# Patient Record
Sex: Male | Born: 1990
Health system: Southern US, Community
[De-identification: ages and names within clinical notes are randomized; demographics above are authoritative.]

## PROBLEM LIST (undated history)

## (undated) DIAGNOSIS — E119 Type 2 diabetes mellitus without complications: Secondary | ICD-10-CM

## (undated) HISTORY — DX: Type 2 diabetes mellitus without complications: E11.9

---
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
## 2013-08-29 ENCOUNTER — Emergency Department: Payer: Self-pay | Admitting: Emergency Medicine

## 2015-02-02 IMAGING — CR RIGHT HAND - COMPLETE 3+ VIEW
1 series · 3 of 3 positions shown · non-contrast
Comparison: 09/29/1998

CLINICAL DATA: Pain

EXAM:
RIGHT HAND - COMPLETE 3+ VIEW

[Series 1: x hand pa right · 0.14mm/px · 3 of 3 slices shown]
[im 1/3]
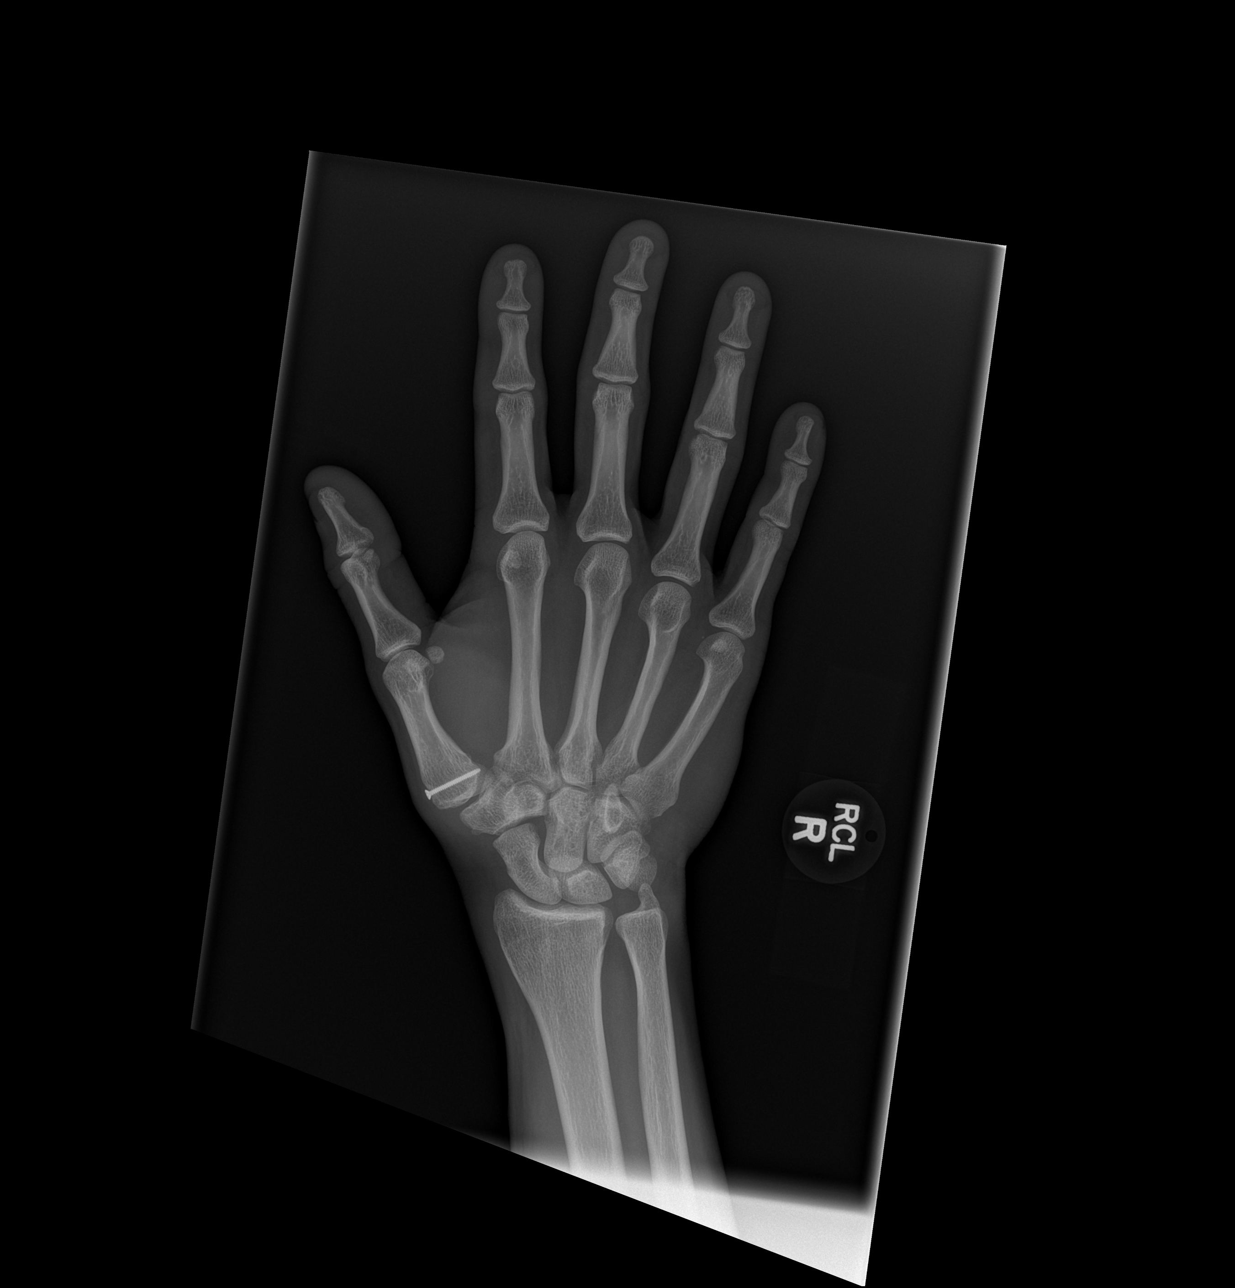
[im 2/3]
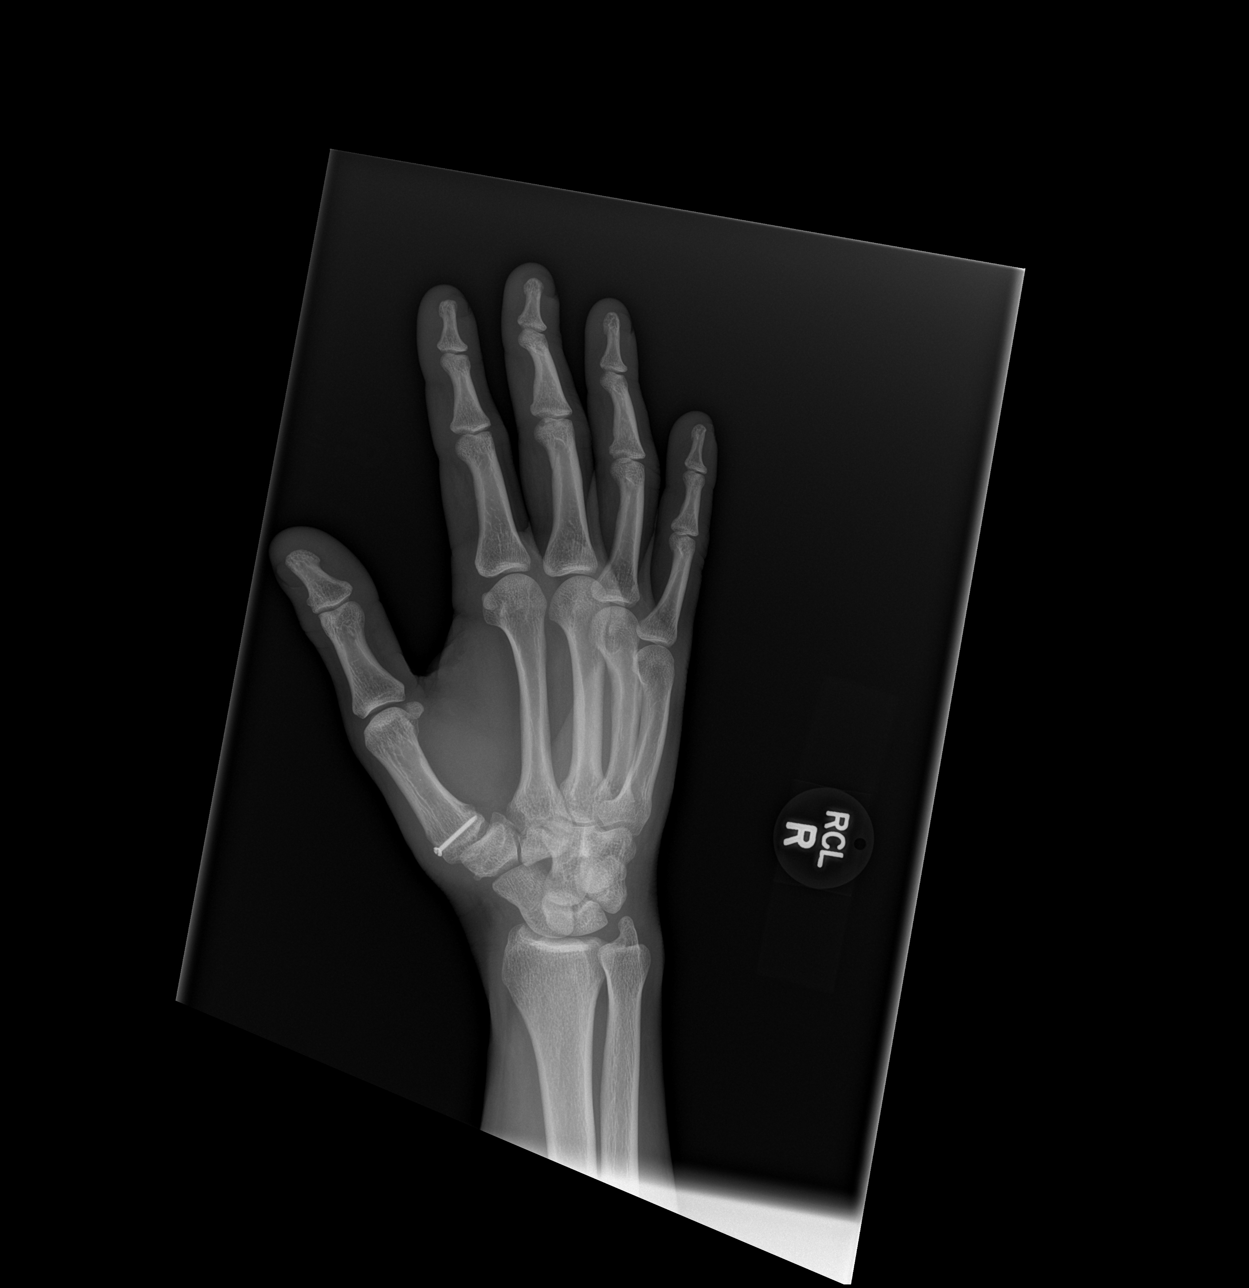
[im 3/3]
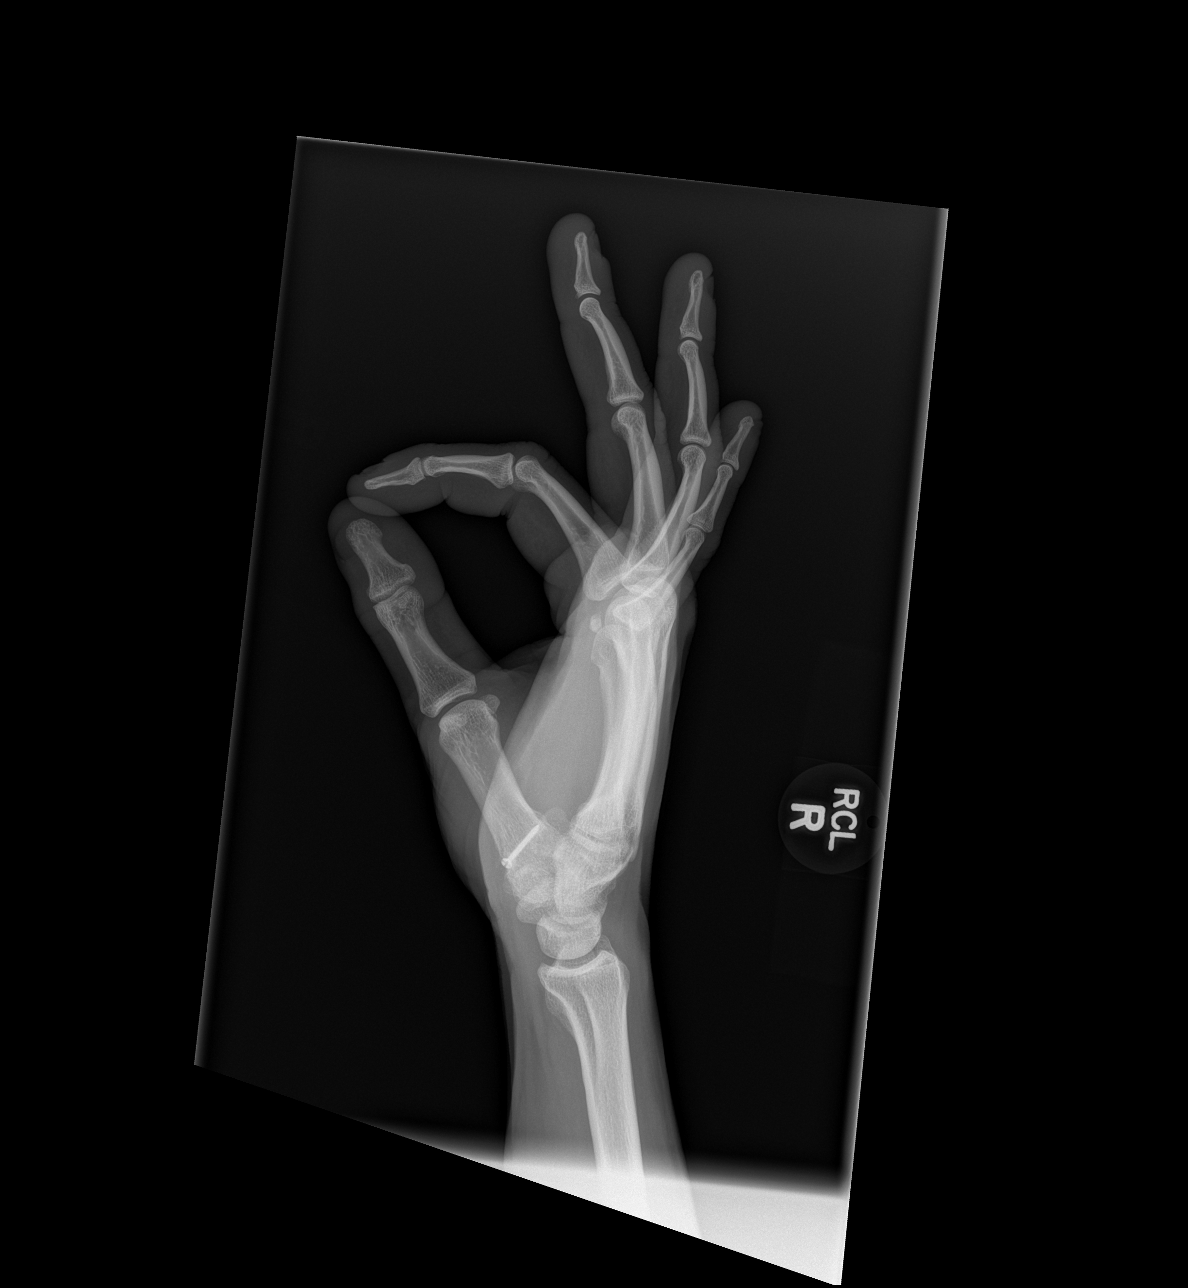

[3 of 3 positions shown; findings below may reference images not displayed]

FINDINGS: Patient is status post remote open reduction internal fixation of
the base of the first metacarpal. Hardware appears intact without
evidence of loosening or failure. The native osseous structures are
unremarkable. There is no evidence of acute fracture nor
dislocation.
IMPRESSION: No evidence of acute osseous abnormalities.

## 2016-10-14 ENCOUNTER — Encounter: Payer: Self-pay | Admitting: Emergency Medicine

## 2016-10-14 ENCOUNTER — Emergency Department
Admission: EM | Admit: 2016-10-14 | Discharge: 2016-10-14 | Disposition: A | Discharge status: Home / Self Care | Payer: Self-pay | Attending: Emergency Medicine | Admitting: Emergency Medicine

## 2016-10-14 DIAGNOSIS — Y939 Activity, unspecified: Secondary | ICD-10-CM | POA: Insufficient documentation

## 2016-10-14 DIAGNOSIS — Y999 Unspecified external cause status: Secondary | ICD-10-CM | POA: Insufficient documentation

## 2016-10-14 DIAGNOSIS — F17210 Nicotine dependence, cigarettes, uncomplicated: Secondary | ICD-10-CM | POA: Insufficient documentation

## 2016-10-14 DIAGNOSIS — Y929 Unspecified place or not applicable: Secondary | ICD-10-CM | POA: Insufficient documentation

## 2016-10-14 DIAGNOSIS — X58XXXA Exposure to other specified factors, initial encounter: Secondary | ICD-10-CM | POA: Insufficient documentation

## 2016-10-14 DIAGNOSIS — S0501XA Injury of conjunctiva and corneal abrasion without foreign body, right eye, initial encounter: Secondary | ICD-10-CM

## 2016-10-14 MED ORDER — FLUORESCEIN SODIUM 1 MG OP STRP
ORAL_STRIP | OPHTHALMIC | Status: AC
  Filled 2016-10-14: qty 1

## 2016-10-14 MED ORDER — POLYMYXIN B-TRIMETHOPRIM 10000-0.1 UNIT/ML-% OP SOLN
1.0000 [drp] | OPHTHALMIC | 0 refills | Status: AC
Start: 2016-10-14 — End: 2016-10-24

## 2016-10-14 MED ORDER — NAPROXEN 500 MG PO TBEC: 500.0000 mg | DELAYED_RELEASE_TABLET | Freq: Two times a day (BID) | ORAL | 0 refills | Status: AC

## 2016-10-14 MED ORDER — OXYCODONE-ACETAMINOPHEN 5-325 MG PO TABS
1.0000 | ORAL_TABLET | Freq: Once | ORAL | Status: AC
Start: 2016-10-14 — End: 2016-10-14
  Administered 2016-10-14: 1 via ORAL
  Filled 2016-10-14: qty 1

## 2016-10-14 MED ORDER — TETRACAINE HCL 0.5 % OP SOLN
OPHTHALMIC | Status: AC
  Filled 2016-10-14: qty 2

## 2016-10-14 MED ORDER — OXYCODONE-ACETAMINOPHEN 5-325 MG PO TABS: 1.0000 | ORAL_TABLET | ORAL | 0 refills | Status: AC | PRN

## 2016-10-14 NOTE — ED Notes (Signed)
Patient c/o scratch/possible foreign body to right eye beginning last night.

## 2016-10-14 NOTE — ED Provider Notes (Signed)
[facility] Emergency Department Provider Note  ____________________________________________  Time seen: Approximately 11:32 PM  I have reviewed the triage vital signs and the nursing notes.   HISTORY  Chief Complaint Eye Pain    HPI Jesse Johnson is a 26 y.o. male presenting to the emergency department with 3/10 right eye pain, photophobia and increased tearing for one day. Patient states that he "rubbed his eyes" before bed last night. Patient states that he has experienced eye irritation since. Patient denies blurry vision, pain with extraocular eye muscle movement, purulent discharge or globe trauma. Patient wears glasses at baseline. Patient works as an Journalist, newspaper. Patient has tried self-irrigation and sunglasses but no other alleviating measures. Patient denies chest pain, chest tightness, shortness of breath, abdominal pain, nausea and vomiting.    History reviewed. No pertinent past medical history.  There are no active problems to display for this patient.   History reviewed. No pertinent surgical history.  Prior to Admission medications   Medication Sig Start Date End Date Taking? Authorizing Provider  naproxen (EC NAPROSYN) 500 MG EC tablet Take 1 tablet (500 mg total) by mouth 2 (two) times daily with a meal. 10/14/16 10/24/16  Orvil Feil, PA-C  oxyCODONE-acetaminophen (ROXICET) 5-325 MG tablet Take 1 tablet by mouth every 4 (four) hours as needed for severe pain. 10/14/16 10/17/16  Orvil Feil, PA-C  trimethoprim-polymyxin b (POLYTRIM) ophthalmic solution Place 1 drop into the right eye every 4 (four) hours. 10/14/16 10/24/16  Orvil Feil, PA-C    Allergies Patient has no known allergies.  History reviewed. No pertinent family history.  Social History Social History  Substance Use Topics  . Smoking status: Current Every Day Smoker    Types: Cigarettes  . Smokeless tobacco: Never Used  . Alcohol use Yes    Review of Systems   Constitutional: No fever/chills Eyes: Patient has right eye pain, excessive tearing and photophobia.  ENT: No upper respiratory complaints. Cardiovascular: no chest pain. Respiratory: no cough. No SOB. Gastrointestinal: No abdominal pain.  No nausea, no vomiting.  No diarrhea.  No constipation. Musculoskeletal: Negative for musculoskeletal pain. Skin: Negative for rash, abrasions, lacerations, ecchymosis. Neurological: Negative for headaches, focal weakness or numbness. ____________________________________________   PHYSICAL EXAM:  VITAL SIGNS: ED Triage Vitals [10/14/16 2127]  Enc Vitals Group     BP 112/79     Pulse Rate 90     Resp 16     Temp 98.9 F (37.2 C)     Temp Source Oral     SpO2 100 %     Weight 170 lb (77.1 kg)     Height 6\' 5"  (1.956 m)     Head Circumference      Peak Flow      Pain Score 4     Pain Loc      Pain Edu?      Excl. in GC?      Constitutional: Alert and oriented. Well appearing and in no acute distress. Eyes: Pupils are equal round and reactive to light bilaterally. Extraocular eye muscles are intact bilaterally. No pain was elicited with extraocular eye muscle movement. Palpebral and bulbar conjunctiva are injected, right. On fluorescein staining, a moderately sized corneal abrasion was visualized, right. A small foreign body was also visualized, right. No scleral injection or perilimbal redness was visualized, right Head: Atraumatic. ENT:      Hematological/Lymphatic/Immunilogical: No cervical lymphadenopathy.  Cardiovascular: Normal rate, regular rhythm. Normal S1 and S2.  Good peripheral circulation. Respiratory: Normal respiratory effort without tachypnea or retractions. Lungs CTAB. Good air entry to the bases with no decreased or absent breath sounds. Musculoskeletal: Full range of motion to all extremities. No gross deformities appreciated. Neurologic:  Normal speech and language. No gross focal neurologic deficits are appreciated.   Skin: No erythema or edema of the skin overlying the upper or lower eyelids was visualized bilaterally. Psychiatric: Mood and affect are normal. Speech and behavior are normal. Patient exhibits appropriate insight and judgement. ____________________________________________   LABS (all labs ordered are listed, but only abnormal results are displayed)  Labs Reviewed - No data to display ____________________________________________  EKG   ____________________________________________  RADIOLOGY   No results found.  ____________________________________________    PROCEDURES  Procedure(s) performed:    Procedures  Eye foreign body removal  Medications  fluorescein 1 MG ophthalmic strip (not administered)  tetracaine (PONTOCAINE) 0.5 % ophthalmic solution (not administered)  oxyCODONE-acetaminophen (PERCOCET/ROXICET) 5-325 MG per tablet 1 tablet (not administered)     ____________________________________________   INITIAL IMPRESSION / ASSESSMENT AND PLAN / ED COURSE  Pertinent labs & imaging results that were available during my care of the patient were reviewed by me and considered in my medical decision making (see chart for details).  Review of the [place] CSRS was performed in accordance of the [facility] prior to dispensing any controlled drugs.     Assessment and Plan: Corneal Abrasion  Patient presents to the emergency department with right eye pain, photophobia and excessive tearing. On physical exam, patient had a retained foreign body and corneal abrasion visualized on fluorescein staining. A portion of the foreign body was removed during this emergency department encounter. There is concern for retained foreign body, right. A referral was made to ophthalmology. Patient was advised to seek care on 10/16/16. Patient was discharged with naproxen, Polytrim and Roxicet due to the size and pain associated with corneal abrasion. Vital signs are reassuring at this time. All  patient questions were answered.  ____________________________________________  FINAL CLINICAL IMPRESSION(S) / ED DIAGNOSES  Final diagnoses:  Abrasion of right cornea, initial encounter      NEW MEDICATIONS STARTED DURING THIS VISIT:  New Prescriptions   NAPROXEN (EC NAPROSYN) 500 MG EC TABLET    Take 1 tablet (500 mg total) by mouth 2 (two) times daily with a meal.   OXYCODONE-ACETAMINOPHEN (ROXICET) 5-325 MG TABLET    Take 1 tablet by mouth every 4 (four) hours as needed for severe pain.   TRIMETHOPRIM-POLYMYXIN B (POLYTRIM) OPHTHALMIC SOLUTION    Place 1 drop into the right eye every 4 (four) hours.        This chart was dictated using voice recognition software/Dragon. Despite best efforts to proofread, errors can occur which can change the meaning. Any change was purely unintentional.    Orvil FeilJaclyn M Apostolos Blagg, PA-C 10/14/16 2346    Merrily BrittleNeil Rifenbark, MD 10/14/16 2350

## 2016-10-14 NOTE — ED Triage Notes (Addendum)
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 Pt states last night when he laid down to sleep he felt like something was in his eye, he brushed his eye to "get it out" then he began to have right eye tearing, pain, sensitivity to light. Pt with red right sclera noted and light sensitivity/pain. Visual acuity right uncorrected 20/30, left uncorrected 20/50.

## 2017-07-03 DIAGNOSIS — E1165 Type 2 diabetes mellitus with hyperglycemia: Secondary | ICD-10-CM | POA: Diagnosis not present

## 2017-07-19 DIAGNOSIS — E1165 Type 2 diabetes mellitus with hyperglycemia: Secondary | ICD-10-CM | POA: Diagnosis not present

## 2017-09-11 DIAGNOSIS — E1165 Type 2 diabetes mellitus with hyperglycemia: Secondary | ICD-10-CM | POA: Diagnosis not present

## 2017-09-12 MED FILL — SYNJARDY XR 12.5-1,000 MG T: 12.5-1000 | 30 days supply | Qty: 60 | Fill #0

## 2017-10-04 DIAGNOSIS — E1165 Type 2 diabetes mellitus with hyperglycemia: Secondary | ICD-10-CM | POA: Diagnosis not present

## 2017-10-09 DIAGNOSIS — E1165 Type 2 diabetes mellitus with hyperglycemia: Secondary | ICD-10-CM | POA: Diagnosis not present

## 2017-10-16 ENCOUNTER — Encounter: Payer: Self-pay | Admitting: Endocrinology

## 2017-10-16 ENCOUNTER — Telehealth: Payer: Self-pay | Admitting: Endocrinology

## 2017-10-16 ENCOUNTER — Ambulatory Visit: Payer: 59 | Admitting: Endocrinology

## 2017-10-16 ENCOUNTER — Encounter: Payer: 59 | Attending: Endocrinology | Admitting: Nutrition

## 2017-10-16 VITALS — BP 118/72 | HR 89 | Ht 77.0 in | Wt 149.0 lb

## 2017-10-16 DIAGNOSIS — E1065 Type 1 diabetes mellitus with hyperglycemia: Secondary | ICD-10-CM | POA: Diagnosis not present

## 2017-10-16 DIAGNOSIS — Z713 Dietary counseling and surveillance: Secondary | ICD-10-CM | POA: Diagnosis not present

## 2017-10-16 MED ORDER — INSULIN DEGLUDEC 100 UNIT/ML ~~LOC~~ SOPN: 15.0000 [IU] | PEN_INJECTOR | Freq: Every day | SUBCUTANEOUS | 1 refills | Status: DC

## 2017-10-16 MED ORDER — FREESTYLE LIBRE 14 DAY READER DEVI: 1.0000 | Freq: Once | 0 refills | Status: AC

## 2017-10-16 MED ORDER — FREESTYLE LIBRE 14 DAY SENSOR MISC: 1.0000 [IU] | 4 refills | Status: DC

## 2017-10-16 MED ORDER — INSULIN LISPRO 100 UNIT/ML (KWIKPEN): PEN_INJECTOR | SUBCUTANEOUS | 1 refills | Status: DC

## 2017-10-16 MED FILL — TRESIBA FLEXTOUCH 100 UNITS: 100 | 80 days supply | Qty: 12 | Fill #0

## 2017-10-16 MED FILL — HUMALOG 100 UNITS/ML KWIKPE: 100 | 62 days supply | Qty: 15 | Fill #0

## 2017-10-16 NOTE — Telephone Encounter (Signed)
[facility] Humalog ph# [phone]

## 2017-10-16 NOTE — Telephone Encounter (Signed)
Per Bonita QuinLinda, 24 units per day. Cassandra called the pharmacy to advise.

## 2017-10-16 NOTE — Progress Notes (Signed)
Patient ID: Jesse Johnson, male   DOB: 08-10-1991, 27 y.o.   MRN: [id number]           Reason for Appointment: Consultation for Type 1 Diabetes  Referring physician: Merri Brunette   History of Present Illness:          Date of diagnosis of type 1 diabetes mellitus: 11/2016       Background history:   In early 2018 he was starting to have symptoms of weakness, increased urination and thirst, blurred vision and weight loss He checked her home blood sugar was 400+ and at his physician's office it was 541 with an A1c of 8.6 Initial labs did not show acidosis He was started on Synjardy at that time with this he thinks his blood sugar started getting better A1c was down to 6.6 done in 01/2017 Subsequently because of insurance difficulties he could not afford this and was tried on metformin and glipizide but blood sugar started going up progressively after this.  Also at some point had tried Trulicity which caused GI side effects  Recent history:    INSULIN regimen Basaglar 10 units in the morning daily  Non-insulin hypoglycemic drugs the patient is taking are: Synjardy 2 tablets daily  Most recent A1c is 10.3 done on 10/09/17  Current management, blood sugar patterns and problems identified:  Over the last couple of months he has had progressive worsening of symptoms of weakness, weight loss, increased thirst and urination  He has been monitoring his blood sugars and they have recently gone up to around 500 and generally higher in the afternoon and evening  This is despite his trying to cut back on a lot of carbohydrates and the amount of sweet tea he was drinking  About a week ago his physician started him on Basaglar 10 units daily in the morning after instructions from an in office pharmacist, also has been continued on Synjardy  Currently the patient has started having less fatigue and frequent urination but his blood sugars still can be up to 400 in the evenings  Lowest blood  sugars recently have been only about 140 fasting, he did not bring any record of his blood sugars or his meter  He is now at his lowest weight in quite some time  Does not feel any nausea but still feels weak    Glucose monitoring:  done 2 times a day         Glucometer: Prime      Blood Glucose readings by recall   PREMEAL Breakfast Lunch Dinner Bedtime  Overall   Glucose range: 140-210 300 400    Median:        Self-care: The diet that the patient has been following is: tries to limit sweet tea and drinks only one time per day.    Typical meal intake: Breakfast is generally biscuits or liver pudding sandwich Snacks frequently during the day and evening               Dietician visit, most recent: None               Exercise:minimal, he works as a Production designer, theatre/television/film person for [employer]  Weight history:  Wt Readings from Last 3 Encounters:  10/16/17 149 lb (67.6 kg)  10/14/16 170 lb (77.1 kg)    Glycemic control:  No results found for: HGBA1C No results found for: GLUF, MICROALBUR, LDLCALC, CREATININE No results found for: MICRALBCREAT  No results found for: FRUCTOSAMINE  Allergies as of 10/16/2017   No Known Allergies     Medication List        Accurate as of 10/16/17  8:55 PM. Always use your most recent med list.          FREESTYLE LIBRE 14 DAY READER Devi 1 Device by Does not apply route once for 1 dose.   FREESTYLE LIBRE 14 DAY SENSOR Misc 1 Units by Does not apply route every 14 (fourteen) days.   insulin degludec 100 UNIT/ML Sopn FlexTouch Pen Commonly known as:  TRESIBA FLEXTOUCH Inject 0.15 mLs (15 Units total) into the skin daily.   insulin lispro 100 UNIT/ML KiwkPen Commonly known as:  HUMALOG KWIKPEN Start with 5 units before each meal and adjust as directed   SYNJARDY XR 12.12-998 MG Tb24 Generic drug:  Empagliflozin-metFORMIN HCl ER       Allergies: No Known Allergies  History reviewed. No pertinent past medical  history.  History reviewed. No pertinent surgical history.  Family History  Problem Relation Age of Onset  . Diabetes Neg Hx   . Heart disease Neg Hx     Social History:  reports that he has been smoking cigarettes.  he has never used smokeless tobacco. He reports that he drinks alcohol. He reports that he uses drugs. Drug: Marijuana.   Review of Systems  Constitutional: Positive for weight loss.  HENT: Negative for headaches.   Eyes: Positive for blurred vision.  Respiratory: Negative for shortness of breath.   Cardiovascular: Negative for leg swelling.  Gastrointestinal: Negative for diarrhea.  Endocrine: Positive for fatigue and decreased libido. Negative for erectile dysfunction.  Genitourinary: Positive for frequency and nocturia.  Musculoskeletal: Negative for joint pain.  Skin: Negative for itching.  Neurological: Negative for weakness and numbness.  Psychiatric/Behavioral: Negative for insomnia.     Lipid history: No labs available from his PCP   No results found for: CHOL, HDL, LDLCALC, LDLDIRECT, TRIG, CHOLHDL         Hypertension:  BP Readings from Last 3 Encounters:  10/16/17 118/72  10/14/16 (!) 128/104    Most recent foot exam: 10/2017  TSH normal in 11/2016  LABS:  No visits with results within 1 Week(s) from this visit.  Latest known visit with results is:  No results found for any previous visit.    Physical Examination:  BP 118/72 (BP Location: Left Arm, Patient Position: Sitting, Cuff Size: Normal)   Pulse 89   Ht 6\' 5"  (1.956 m)   Wt 149 lb (67.6 kg)   SpO2 97%   BMI 17.67 kg/m   GENERAL:    Very slender and relatively thin  HEENT:         Eye exam shows normal external appearance.  Fundus exam shows no retinopathy.  Oral exam shows somewhat dry mucosa .  NECK:   There is no lymphadenopathy Thyroid is not enlarged and no nodules felt.  Carotids are normal to palpation and no bruit heard  LUNGS:         Chest is symmetrical. Lungs  are clear to auscultation.Marland Kitchen.   HEART:         Heart sounds:  S1 and S2 are normal. No murmur or click heard., no S3 or S4.   ABDOMEN:   There is no distention present. Liver and spleen are not palpable. No other mass or tenderness present.    NEUROLOGICAL:   Ankle jerks are normal bilaterally.    Diabetic Foot Exam - Simple   Simple  Foot Form Diabetic Foot exam was performed with the following findings:  Yes   Visual Inspection No deformities, no ulcerations, no other skin breakdown bilaterally:  Yes Sensation Testing Intact to touch and monofilament testing bilaterally:  Yes Pulse Check Posterior Tibialis and Dorsalis pulse intact bilaterally:  Yes Comments            Vibration sense is moderately reduced in distal first toes.  MUSCULOSKELETAL:  There is no swelling or deformity of the peripheral joints.     EXTREMITIES:     There is no edema.  SKIN:       No rash or lesions of concern.        ASSESSMENT:  Diabetes type 1, uncontrolled     Patient had initial diagnosis made in April 2018 with marked hyperglycemia and may have had subsequent honeymoon phase with apparent response to oral agents However for several months his blood sugars have been progressively escalating and he is now significantly symptomatic with marked hyperglycemia and progressive weight loss  He has had minimal diabetes education and still can make significant changes in his diet with improvement in simple sugar and high fat intake He has been monitoring his blood sugar with a generic monitor that was not evaluated today  Complications of diabetes: None, has decreased vibratory sense in his feet, no obvious adenopathy No erectile dysfunction but has decreased libido  Renal function previously normal, creatinine 1.2 in 4/18, no recent labs available  PLAN:    Start basal bolus insulin regimen  Discussed in detail the diagnosis of type 1 diabetes and insulin deficiency  Discussed actions of basal and  bolus insulins and normal insulin secretion patterns  Explained to him that he currently does not have any bolus insulin to cover his mealtime hypoglycemia causing progressively higher blood sugars from morning to evening  He will have no benefit in taking Synjardy, may be also further worsening his weight loss  He will start with HUMALOG insulin before each meal Today discussed in detail the need for mealtime insulin to cover postprandial spikes, action of mealtime insulin, use of the insulin pen, timing and action of the rapid acting insulin as well as starting dose and dosage titration to target the two-hour reading of under 180  Also discussed reviewing blood sugar patterns after meals and titrating insulin up or down based on his blood sugar results on specific meals  He was also advised to try and use the freestyle [place] system for continuous glucose monitoring which would provide him with more detailed and complete information on his blood sugar patterns.  He will be educated about this by the nurse educator today, he will also check to see if this is covered by insurance  He will switch Hospital doctor to [place] which will provide more consistent 24-hour insulin levels and less propensity for hypoglycemia  For now he will continue the same dose but next week start titrating every 3 days by 2 units to keep morning sugars under 130  Also discussed that eventually he should be on a closed loop insulin pump for optimal control  He will be referred to a dietitian for NUTRITION counseling and carbohydrate counting  Patient Instructions  HUMALOG insulin: Start taking this before every meal as follows  Take 5 units before breakfast, 4 units before lunch and 6 units before dinner to start with  Also take 3 units for any large snack after dinner  Check blood sugars preferably about 2 hours after eating to  see if the Humalog doses adequate  If after any given meal blood sugar is still running  consistently over 180 increase the dose for that meal by 1-2 units Also if blood sugars are starting to be below 100 after a particular meal reduce the dose by 2 units  Check to see if insurance will cover the freestyle Libre sensor   BASAGLAR: This will be continued but if you're able to get TRESIBA insulin from the pharmacy we will switch to this starting with 10 units in the morning   TRESIBA insulin: This insulin provides blood sugar control in between meals for up to 24 hours.    Starting next week increase by 2 units every 3 days until the waking up sugars are under 130.  Then continue the same dose.       If blood sugar is under 90 for 2 days in a row, reduce the dose by 2 units.  Note that this insulin does not control the rise of blood sugar with meals       Counseling time on subjects discussed in assessment and plan sections is over 50% of today's 60 minute visit   Consultation note has been sent to the referring physician  Reather Littler 10/16/2017, 8:55 PM   Note: This office note was prepared with Dragon voice recognition system technology. Any transcriptional errors that result from this process are unintentional.

## 2017-10-16 NOTE — Patient Instructions (Signed)
HUMALOG insulin: Start taking this before every meal as follows  Take 5 units before breakfast, 4 units before lunch and 6 units before dinner to start with  Also take 3 units for any large snack after dinner  Check blood sugars preferably about 2 hours after eating to see if the Humalog doses adequate  If after any given meal blood sugar is still running consistently over 180 increase the dose for that meal by 1-2 units Also if blood sugars are starting to be below 100 after a particular meal reduce the dose by 2 units  Check to see if insurance will cover the freestyle Libre sensor   BASAGLAR: This will be continued but if you're able to get TRESIBA insulin from the pharmacy we will switch to this starting with 10 units in the morning   TRESIBA insulin: This insulin provides blood sugar control in between meals for up to 24 hours.    Starting next week increase by 2 units every 3 days until the waking up sugars are under 130.  Then continue the same dose.       If blood sugar is under 90 for 2 days in a row, reduce the dose by 2 units.  Note that this insulin does not control the rise of blood sugar with meals

## 2017-10-18 NOTE — Patient Instructions (Signed)
Take the [place] once a day.  After 3 days, if morning blood sugars are still over 130, increase the dose by 2 units, and wait 3 more days. Call if quesitons.

## 2017-10-18 NOTE — Progress Notes (Signed)
We discussed how this new tresiba is different from the [place].  We also discussed how/why he needs this insulin, and the need for Humalog insulin as well.  WE discussed the timing of each insulin, the need for 4 blood sugar readings, and what each of those readings tells him about which insulin he has injected. He reported good understanding of this.  We discussed the protocol for adjusting the Tresiba insulin, and he reported good understanding of this.   We discussed what carbohydrates are, and the need to increase/decrease the Humalog dose based on the amount of carbs eaten.  We also dicussed the need for balalnced meals and what consists of balanced meals. He reported good understanding of this as well.   He was given directions on how to use the pens--something he had not been given, and symptoms and treatment of low blood sugar He had no final questions.

## 2017-10-22 ENCOUNTER — Telehealth: Payer: Self-pay | Admitting: Nutrition

## 2017-10-22 NOTE — Telephone Encounter (Signed)
Please confirm that he is taking [place] now okay to increase to 20 units.  Also are his blood sugars after dinner controlled with Humalog?

## 2017-10-22 NOTE — Telephone Encounter (Signed)
Patient reports that he is "now up to 15u on his long acting insulin and his FBSs are still in the 300s. Today it was 342.  Is taking almost double the fasting acting insulin that we have recommended" Plan:  Increase the long acting to 20u.

## 2017-10-23 NOTE — Telephone Encounter (Signed)
Pt. Reported having increased his Tesiba this AM to 20u.  Inetta FermoYesteday his acS reading was 280.  He took 10 u of Humalog, and 2hr. PcS: low 300s.   He asked if he could take too much Humalog.  He is doing 10u ac meals Discussed the need to take extra Humalog insulin acS--1 unit dropping him 50 points when over 200.  He reported good understanding of this.

## 2017-10-29 ENCOUNTER — Other Ambulatory Visit: Payer: Self-pay | Admitting: *Deleted

## 2017-10-29 NOTE — Telephone Encounter (Signed)
PA for [employer] Libre 14 day reader initiated via CoverMyMeds.

## 2017-11-07 ENCOUNTER — Telehealth: Payer: Self-pay | Admitting: Endocrinology

## 2017-11-07 NOTE — Telephone Encounter (Signed)
Need PA for free style meter, [facility] [place] [address]  And a refill for the insulin lispro [facility](HUMALOG KWIKPEN) 100 UNIT/ML Ulice BrilliantKiwkPen [[id number]

## 2017-11-09 MED FILL — FREESTYLE LIBRE 14 DAY READ: 1 days supply | Qty: 1 | Fill #0

## 2017-11-12 ENCOUNTER — Other Ambulatory Visit: Payer: Self-pay

## 2017-11-12 MED ORDER — INSULIN LISPRO 100 UNIT/ML (KWIKPEN): PEN_INJECTOR | SUBCUTANEOUS | 2 refills | Status: DC

## 2017-11-12 MED FILL — FREESTYLE LIBRE 14 DAY SENS: 28 days supply | Qty: 2 | Fill #0

## 2017-11-12 NOTE — Telephone Encounter (Signed)
[facility] and patient contacted- humalog refilled

## 2017-11-13 ENCOUNTER — Telehealth: Payer: Self-pay | Admitting: Endocrinology

## 2017-11-13 NOTE — Telephone Encounter (Signed)
[facility] on [facility] prescription that was sent over to them   insulin lispro (HUMALOG KWIKPEN) 100 UNIT/ML KiwkPen   [phone]

## 2017-11-14 ENCOUNTER — Other Ambulatory Visit: Payer: Self-pay

## 2017-11-14 MED ORDER — INSULIN LISPRO 100 UNIT/ML (KWIKPEN): PEN_INJECTOR | SUBCUTANEOUS | 2 refills | Status: DC

## 2017-11-14 NOTE — Telephone Encounter (Signed)
[facility] with recommended dosage

## 2017-11-14 NOTE — Telephone Encounter (Signed)
How much Humalog as he taking daily?  Also confirmed that he has started using the FreeStyle libre sensor which has been approved

## 2017-11-14 NOTE — Telephone Encounter (Signed)
Left detailed vm requesting he call back to give exact insulin dosage and let us know if he is using the [place]

## 2017-11-14 NOTE — Telephone Encounter (Signed)
Spoke to the patient and he stated that he started the [place] today and he is taking insulin as follows: Humalog- 8 units at breakfast                  8-10 at lunch                  10-15 at dinner depending on what he eats Jesse Johnson is- 30 units in the morning  Is this ok please advise

## 2017-11-14 NOTE — Telephone Encounter (Signed)
Pharmacy stated that they need a max dosage to go with rx pt stated that he was at 24 units but that he was using more please advise

## 2017-11-14 NOTE — Telephone Encounter (Signed)
Ok, change total daily max  dose to 40

## 2017-11-15 DIAGNOSIS — E119 Type 2 diabetes mellitus without complications: Secondary | ICD-10-CM | POA: Diagnosis not present

## 2017-11-15 LAB — HM DIABETES EYE EXAM

## 2017-11-16 ENCOUNTER — Ambulatory Visit: Payer: 59 | Admitting: Dietician

## 2017-11-16 ENCOUNTER — Other Ambulatory Visit: Payer: Self-pay

## 2017-11-16 ENCOUNTER — Encounter: Payer: 59 | Admitting: Endocrinology

## 2017-11-16 MED ORDER — INSULIN DEGLUDEC 100 UNIT/ML ~~LOC~~ SOPN
15.0000 [IU] | PEN_INJECTOR | Freq: Every day | SUBCUTANEOUS | 1 refills | Status: DC
Start: 2017-11-16 — End: 2017-12-04

## 2017-11-16 MED ORDER — INSULIN LISPRO 100 UNIT/ML (KWIKPEN): PEN_INJECTOR | SUBCUTANEOUS | 2 refills | Status: DC

## 2017-11-16 NOTE — Progress Notes (Signed)
This encounter was created in error - please disregard.

## 2017-11-25 MED FILL — HUMALOG 100 UNITS/ML KWIKPE: 100 | 50 days supply | Qty: 15 | Fill #0

## 2017-11-26 MED FILL — FREESTYLE LIBRE 14 DAY SENS: 28 days supply | Qty: 2 | Fill #1

## 2017-11-29 ENCOUNTER — Encounter: Payer: Self-pay | Admitting: Endocrinology

## 2017-12-04 ENCOUNTER — Other Ambulatory Visit (INDEPENDENT_AMBULATORY_CARE_PROVIDER_SITE_OTHER): Payer: 59

## 2017-12-04 ENCOUNTER — Other Ambulatory Visit: Payer: Self-pay

## 2017-12-04 ENCOUNTER — Telehealth: Payer: Self-pay | Admitting: Endocrinology

## 2017-12-04 DIAGNOSIS — E1065 Type 1 diabetes mellitus with hyperglycemia: Secondary | ICD-10-CM

## 2017-12-04 LAB — BASIC METABOLIC PANEL
BUN: 12 mg/dL (ref 6–23)
CHLORIDE: 102 meq/L (ref 96–112)
CO2: 31 meq/L (ref 19–32)
Calcium: 9.3 mg/dL (ref 8.4–10.5)
Creatinine, Ser: 0.84 mg/dL (ref 0.40–1.50)
GFR: 115.52 mL/min (ref >60.00)
Glucose, Bld: 202 mg/dL — ABNORMAL HIGH (ref 70–99)
Potassium: 4.4 mEq/L (ref 3.5–5.1)
SODIUM: 136 meq/L (ref 135–145)

## 2017-12-04 MED ORDER — INSULIN DEGLUDEC 100 UNIT/ML ~~LOC~~ SOPN: 30.0000 [IU] | PEN_INJECTOR | Freq: Every day | SUBCUTANEOUS | 0 refills | Status: DC

## 2017-12-04 MED ORDER — INSULIN DEGLUDEC 100 UNIT/ML ~~LOC~~ SOPN: 15.0000 [IU] | PEN_INJECTOR | Freq: Every day | SUBCUTANEOUS | 0 refills | Status: DC

## 2017-12-04 MED FILL — TRESIBA FLEXTOUCH 100 UNITS: 100 | 49 days supply | Qty: 15 | Fill #0 | Status: TO

## 2017-12-04 NOTE — Telephone Encounter (Signed)
Patient has a appt Friday, and is out of his insulin [place] and want to know if he can get enough until-his appt

## 2017-12-04 NOTE — Telephone Encounter (Signed)
One pen sent to hold pt over until office visit.

## 2017-12-04 NOTE — Telephone Encounter (Signed)
Correct dosage sent. 

## 2017-12-04 NOTE — Telephone Encounter (Signed)
insulin degludec (TRESIBA FLEXTOUCH) 100 UNIT/ML SOPN FlexTouch Pen  [facility] outpatient is calling about a prescription that was just sent over. It was sent for 15 units and it should have been sent for 30 units.     Please advise

## 2017-12-05 LAB — FRUCTOSAMINE: Fructosamine: 430 umol/L — ABNORMAL HIGH (ref 0–285)

## 2017-12-07 ENCOUNTER — Encounter: Payer: Self-pay | Admitting: Endocrinology

## 2017-12-07 ENCOUNTER — Ambulatory Visit: Payer: 59 | Admitting: Endocrinology

## 2017-12-07 VITALS — BP 110/68 | HR 81 | Ht 77.0 in | Wt 182.0 lb

## 2017-12-07 DIAGNOSIS — E1065 Type 1 diabetes mellitus with hyperglycemia: Secondary | ICD-10-CM | POA: Diagnosis not present

## 2017-12-07 NOTE — Patient Instructions (Addendum)
Take am Humalog 10 min before eating, take 10-12 units  Divide total Carbs by 10 to get Humalog dose at meals  Take 50% insulin if going to be active  Use only Coke glucose tabs for lo sugars

## 2017-12-07 NOTE — Progress Notes (Signed)
Patient ID: Jesse Johnson, male   DOB: May 10, 1991, 27 y.o.   MRN: [id number]           Reason for Appointment: Follow-up for Type 1 Diabetes  Referring physician: Merri Brunette   History of Present Illness:          Date of diagnosis of type 1 diabetes mellitus: 11/2016       Background history:   In early 2018 he was starting to have symptoms of weakness, increased urination and thirst, blurred vision and weight loss He checked her home blood sugar was 400+ and at his physician's office it was 541 with an A1c of 8.6 Initial labs did not show acidosis He was started on Synjardy at that time with this he thinks his blood sugar started getting better A1c was down to 6.6 done in 01/2017 Subsequently because of insurance difficulties he could not afford this and was tried on metformin and glipizide but blood sugar started going up progressively after this.  Also at some point had tried Trulicity which caused GI side effects  Recent history:    INSULIN regimen: Tresiba 30 units in the morning daily, Humalog 6-8 units before meals   Most recent A1c is 10.3 done on 10/09/17, fructosamine is now 430  Current management, blood sugar patterns and problems identified:  On his initial visit he had marked hyperglycemia and was started on a basal bolus insulin regimen.  He was also started on the [facility] for glucose monitoring  Although he has had a consultation with the diabetes educator he is more able to get consistent control of his blood sugar throughout the day with marked variability at all times including overnight  Because of his variable diet and activity level he has had difficulty adjusting his mealtime doses  Although he has tried to take his insulin consistently before eating his dosage is very well  He has not been adjusting his doses based on carbohydrate counting or results of his glucose monitoring  HIGHEST blood sugar after breakfast with average nearly 300, he may  have some relatively high fat meals also although not eating out much in the morning  However he has had a tendency to low blood sugars sometimes after lunch and occasionally around 8 PM  He will sometimes treat low blood sugars with a Snickers bar and get rebound hyperglycemia  This may be related to his taking HUMALOG insulin to cover his lunch with extra doses for high readings and then being more active  He may not always take insulin before eating if he has a relatively low blood sugar before the meal  FASTING blood sugars have been quite variable and low normal only yesterday and overnight blood sugars appear to be better the last 3 days  However he feels better since his last visit and has gained 33 pounds  He has not seen the dietitian as yet for meal planning    Glucose monitoring:  done 2 times a day         Glucometer:  Freestyle libre      Blood Glucose averages on download  Mean values apply above for all meters except median for One Touch  PRE-MEAL Fasting Lunch Dinner Bedtime Overall  Glucose range:      46-423  Mean/median:  181  217  227  269  228   POST-MEAL PC Breakfast PC Lunch PC Dinner  Glucose range:     Mean/median:  284  246  CGM use % of time  87  Average and SD  228+/-88  Time in range  33      %  % Time Above 180  66  % Time above 250   % Time Below target  1    Self-care: The diet that the patient has been following is: tries to limit sweet tea and drinks only one time per day.    Typical meal intake: Breakfast is generally biscuits or liver pudding sandwich Snacks frequently during the day and evening               Dietician visit, most recent: None CDU visit: 10/2017               Exercise:minimal, he works as a Production designer, theatre/television/filmmaintenance person for [employer]  Weight history:  Wt Readings from Last 3 Encounters:  12/07/17 182 lb (82.6 kg)  10/16/17 149 lb (67.6 kg)  10/14/16 170 lb (77.1 kg)    Glycemic control:  No results found  for: HGBA1C Lab Results  Component Value Date   CREATININE 0.84 12/04/2017   No results found for: MICRALBCREAT  Lab Results  Component Value Date   FRUCTOSAMINE 430 (H) 12/04/2017      Allergies as of 12/07/2017   No Known Allergies     Medication List        Accurate as of 12/07/17 12:09 PM. Always use your most recent med list.          FREESTYLE LIBRE 14 DAY SENSOR Misc 1 Units by Does not apply route every 14 (fourteen) days.   insulin degludec 100 UNIT/ML Sopn FlexTouch Pen Commonly known as:  TRESIBA FLEXTOUCH Inject 0.3 mLs (30 Units total) into the skin daily.   insulin lispro 100 UNIT/ML KiwkPen Commonly known as:  HUMALOG KWIKPEN Inject daily max dose of 30 units per day       Allergies: No Known Allergies  History reviewed. No pertinent past medical history.  History reviewed. No pertinent surgical history.  Family History  Problem Relation Age of Onset  . Diabetes Neg Hx   . Heart disease Neg Hx     Social History:  reports that he has been smoking cigarettes.  He has never used smokeless tobacco. He reports that he drinks alcohol. He reports that he has current or past drug history. Drug: Marijuana.   Review of Systems   Lipid history: No labs available from his PCP   No results found for: CHOL, HDL, LDLCALC, LDLDIRECT, TRIG, CHOLHDL         Hypertension: Not present  BP Readings from Last 3 Encounters:  12/07/17 110/68  10/16/17 118/72  10/14/16 (!) 128/104    Most recent foot exam: 10/2017  TSH normal in 11/2016  LABS:  Lab on 12/04/2017  Component Date Value Ref Range Status  . Fructosamine 12/04/2017 430* 0 - 285 umol/L Final   Comment: Published reference interval for apparently healthy subjects between age 28 and 2660 is 56205 - 285 umol/L and in a poorly controlled diabetic population is 228 - 563 umol/L with a mean of 396 umol/L.   Marland Kitchen. Sodium 12/04/2017 136  135 - 145 mEq/L Final  . Potassium 12/04/2017 4.4  3.5 - 5.1  mEq/L Final  . Chloride 12/04/2017 102  96 - 112 mEq/L Final  . CO2 12/04/2017 31  19 - 32 mEq/L Final  . Glucose, Bld 12/04/2017 202* 70 - 99 mg/dL Final  . BUN 16/10/960404/23/2019 12  6 -  23 mg/dL Final  . Creatinine, Ser 12/04/2017 0.84  0.40 - 1.50 mg/dL Final  . Calcium 69/62/9528 9.3  8.4 - 10.5 mg/dL Final  . GFR 41/32/4401 115.52  >60.00 mL/min Final    Physical Examination:  BP 110/68 (BP Location: Left Arm, Patient Position: Sitting, Cuff Size: Normal)   Pulse 81   Ht 6\' 5"  (1.956 m)   Wt 182 lb (82.6 kg)   SpO2 97%   BMI 21.58 kg/m            ASSESSMENT:  Diabetes type 1, uncontrolled     See history of present illness for detailed discussion of current diabetes management, blood sugar patterns and problems identified  Currently he has been taking [place] and Humalog With his dose of 30 units of TRESIBA his overnight and fasting blood sugars are not consistent but he has relatively good readings the last 3 nights with a slight tendency to dawn phenomena is seen on his CGM  He is having considerable difficulty figuring out how to appropriately cover his meals and also avoid hypoglycemia with mealtime insulin especially after lunch when he may be more active at times Currently not counting carbohydrates He is clearly getting inadequate insulin at breakfast when he has consistently high postprandial readings with his 6-8 unit dosage  PLAN:    He will try to use carbohydrate counting as an estimate for his Humalog and for now we will start with 1: 10 coverage  He will need to take another 2 to 5 units for higher fat meals  Probably take 10 to 12 units Humalog to try and cover his breakfast and also inject 10 to 15 minutes before the meal in the morning  He will reduce his mealtime dose to 50% when planning to be very active  Also if his pre-meal blood sugar is low normal he can take the insulin right after eating  Discussed appropriate treatment of hypoglycemia, to avoid  candy bars and use glucose tablets and regular Coke  He will review carbohydrate counting with dietitian soon and use this to guide mealtime coverage  He was given information on the Medtronic 670 pump and discussed how this would work and integrate sensor information to regulating his basal rates.  He will check on the coverage for this and start this when able to  Patient Instructions  Take am Humalog 10 min before eating, take 10-12 units  Divide total Carbs by 10 to get Humalog dose at meals     Counseling time on subjects discussed in assessment and plan sections is over 50% of today's 25 minute visit  12/07/2017, 12:09 PM   Note: This office note was prepared with Dragon voice recognition system technology. Any transcriptional errors that result from this process are unintentional.

## 2017-12-10 ENCOUNTER — Encounter: Payer: Self-pay | Admitting: Dietician

## 2017-12-10 ENCOUNTER — Encounter: Payer: 59 | Attending: Endocrinology | Admitting: Dietician

## 2017-12-10 DIAGNOSIS — E1065 Type 1 diabetes mellitus with hyperglycemia: Secondary | ICD-10-CM | POA: Insufficient documentation

## 2017-12-10 DIAGNOSIS — Z713 Dietary counseling and surveillance: Secondary | ICD-10-CM | POA: Insufficient documentation

## 2017-12-10 NOTE — Patient Instructions (Signed)
Practice carbohydrate counting. Generally 1 unit of insulin for every 10 grams of carbohydrates that you eat.  Be sure to count your carbs in what you drink.  Prefer that you drink beverages without sugar.  Decrease the amount of insulin that you take by 50% at your meal if you are going to be active.  Consider what your blood sugar is prior to the meal.  You may need to be more careful about how much insulin you take if your blood sugar is lower especially if you tend to go low after the meal.  Add an additional 2-5 units of insulin for a high fat meal.  Consider checking your blood sugar before and after your meal.  Generally the difference should be 40-60 points.  Exercise will make your blood sugar go down.  Eat a snack with 15-30 grams of carbohydrates plus a protein prior to mowing the lawn.  Consider a snack before bed if your blood sugar is going down in the am.  Treat low blood sugar with 15 grams of fast acting sugar (3-4 glucose tabs, 1/2 cup regular soda, or 1/2 cup juice).  Put the Calorie [employer] on your phone.

## 2017-12-12 NOTE — Progress Notes (Signed)
Diabetes Self-Management Education  Visit Type: First/Initial  Appt. Start Time: 1600 Appt. End Time: 0175  12/12/2017  [name], identified by name and date of birth, is a 27 y.o. male with a diagnosis of Diabetes: Type 1 diagnosed 11/2016.   CGM - [employer].  He occasionally has a low that he is not aware of.  Discussed that there are CGM's with alarms for low blood sugar but that the [facility] does not have one.  Dr. Dwyane Dee discussed the pump with him but he states he just got comfortable with injections. Medications:  Tresiba 30 units q am, Humalog 2-8 units before each meal.  He has not been adding the recommended 2-5 units more of insulin for a high fat meal.  Occasionally he does not take the insulin before a meal (out with friends) and will take it after.  He was also instructed to take 50% less insulin when planning on being more active.  He does find it difficult to plan ahead at times.  He states he is most likely to get a low blood sugar when he is doing yard work.  He reports better control on the Tresiba.  Insulin to Carb ratio for the Humalog instructed to be 1:10 but patient will give 1:15 at times with good control.    Patient lives with his wife.  She is a Electrical engineer at [employer].  He now is under her insurance since they got married.  He initially was uninsured. His wife does the shopping and cooks dinner.  He states that she cooks very healthfully.  When he is on his own, he generally eats fast food.  Current breakfast in unbalanced with a lack of protein at times or very high fat.   We looked at the [employer] app and showed him how much fat was in some of the choices that he makes and alternative options.   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  ASSESSMENT  Height 6' 3.5" (1.918 m), weight 183 lb (83 kg). Body mass index is 22.57 kg/m.  Diabetes Self-Management Education - 12/10/17 1627      Visit Information   Visit Type   First/Initial      Initial Visit   Diabetes Type  Type 1    Are you currently following a meal plan?  No    Are you taking your medications as prescribed?  Yes    Date Diagnosed  11/2016      Health Coping   How would you rate your overall health?  Good      Psychosocial Assessment   Patient Belief/Attitude about Diabetes  Motivated to manage diabetes    Self-care barriers  None    Self-management support  Doctor's office;Family    Other persons present  Patient    Patient Concerns  Nutrition/Meal planning;Other (comment) carb counting    Special Needs  None    Preferred Learning Style  No preference indicated    Learning Readiness  Ready    How often do you need to have someone help you when you read instructions, pamphlets, or other written materials from your doctor or pharmacy?  1 - Never    What is the last grade level you completed in school?  12th grade      Pre-Education Assessment   Patient understands the diabetes disease and treatment process.  Needs Instruction    Patient understands incorporating nutritional management into lifestyle.  Needs Instruction  Patient undertands incorporating physical activity into lifestyle.  Needs Instruction    Patient understands using medications safely.  Needs Instruction    Patient understands monitoring blood glucose, interpreting and using results  Needs Instruction    Patient understands prevention, detection, and treatment of acute complications.  Needs Instruction    Patient understands prevention, detection, and treatment of chronic complications.  Needs Instruction    Patient understands how to develop strategies to address psychosocial issues.  Needs Instruction    Patient understands how to develop strategies to promote health/change behavior.  Needs Instruction      Complications   Last HgB A1C per patient/outside source  10.3 % 10/09/17 and Fructosamine 430 11/2016    How often do you check your blood sugar?  > 4 times/day     Fasting Blood glucose range (mg/dL)  >200    Postprandial Blood glucose range (mg/dL)  >200;180-200;130-179    Number of hypoglycemic episodes per month  30    Can you tell when your blood sugar is low?  Yes    What do you do if your blood sugar is low?  regular soda    Number of hyperglycemic episodes per week  14    Can you tell when your blood sugar is high?  No    Have you had a dilated eye exam in the past 12 months?  Yes    Have you had a dental exam in the past 12 months?  Yes    Are you checking your feet?  No      Dietary Intake   Breakfast  waffles (white) with strawberry jelly OR 2-4 mini Jimmie Dean sausage biscuits OR fried liver pudding sandwich on white toast 9 am    Snack (morning)  none    Lunch  subway or chik fil-A or cookout or diner (cheeseburger or BBQ sandwich or chicken sandwich and occasional 1/2 order fries) 11-2    Snack (afternoon)  none or occasional rice krispie treat    Dinner  [place] chicken, mac and cheese, cabbage OR beef, mashed potatoes, green beans occasioal cookies or carb smart ice cream    Snack (evening)  occasional jelly on white toast    Beverage(s)  decaf coffee with SF creamer and 2 heaping spoons sugar (4 cups daily), water, sweet tea (20 ounces daily)      Exercise   Exercise Type  Light (walking / raking leaves) walking on the job, yard work, rides dirt bike      Patient Education   Previous Diabetes Education  Yes (please comment) 10/2017 Vaughan Basta CDE    Disease state   Explored patient's options for treatment of their diabetes    Nutrition management   Carbohydrate counting;Food label reading, portion sizes and measuring food.;Effects of alcohol on blood glucose and safety factors with consumption of alcohol.;Meal options for control of blood glucose level and chronic complications.;Information on hints to eating out and maintain blood glucose control.;Meal timing in regards to the patients' current diabetes medication.    Physical activity  and exercise   Role of exercise on diabetes management, blood pressure control and cardiac health.;Identified with patient nutritional and/or medication changes necessary with exercise.    Medications  Reviewed patients medication for diabetes, action, purpose, timing of dose and side effects.    Monitoring  Identified appropriate SMBG and/or A1C goals.;Daily foot exams;Yearly dilated eye exam    Acute complications  Taught treatment of hypoglycemia - the 15 rule.  Chronic complications  Relationship between chronic complications and blood glucose control    Psychosocial adjustment  Worked with patient to identify barriers to care and solutions    Personal strategies to promote health  Lifestyle issues that need to be addressed for better diabetes care      Individualized Goals (developed by patient)   Nutrition  Adjust meds/carbs with exercise as discussed;Other (comment);General guidelines for healthy choices and portions discussed    Physical Activity  Exercise 5-7 days per week;30 minutes per day    Medications  take my medication as prescribed    Monitoring   test my blood glucose as discussed    Problem Solving  Eating out choices    Reducing Risk  stop smoking    Health Coping  ask for help with (comment)      Post-Education Assessment   Patient understands the diabetes disease and treatment process.  Demonstrates understanding / competency    Patient understands incorporating nutritional management into lifestyle.  Demonstrates understanding / competency    Patient undertands incorporating physical activity into lifestyle.  Demonstrates understanding / competency    Patient understands using medications safely.  Demonstrates understanding / competency    Patient understands monitoring blood glucose, interpreting and using results  Demonstrates understanding / competency    Patient understands prevention, detection, and treatment of acute complications.  Demonstrates understanding /  competency    Patient understands prevention, detection, and treatment of chronic complications.  Demonstrates understanding / competency    Patient understands how to develop strategies to address psychosocial issues.  Needs Review    Patient understands how to develop strategies to promote health/change behavior.  Needs Review      Outcomes   Expected Outcomes  Demonstrated interest in learning. Expect positive outcomes    Future DMSE  6 months    Program Status  Completed       Individualized Plan for Diabetes Self-Management Training:   Learning Objective:  Patient will have a greater understanding of diabetes self-management. Patient education plan is to attend individual and/or group sessions per assessed needs and concerns.   Plan:   Patient Instructions  Practice carbohydrate counting. Generally 1 unit of insulin for every 10 grams of carbohydrates that you eat.  Be sure to count your carbs in what you drink.  Prefer that you drink beverages without sugar.  Decrease the amount of insulin that you take by 50% at your meal if you are going to be active.  Consider what your blood sugar is prior to the meal.  You may need to be more careful about how much insulin you take if your blood sugar is lower especially if you tend to go low after the meal.  Add an additional 2-5 units of insulin for a high fat meal.  Consider checking your blood sugar before and after your meal.  Generally the difference should be 40-60 points.  Exercise will make your blood sugar go down.  Eat a snack with 15-30 grams of carbohydrates plus a protein prior to mowing the lawn.  Consider a snack before bed if your blood sugar is going down in the am.  Treat low blood sugar with 15 grams of fast acting sugar (3-4 glucose tabs, 1/2 cup regular soda, or 1/2 cup juice).  Put the Calorie [employer] on your phone.      Expected Outcomes:  Demonstrated interest in learning. Expect positive  outcomes  Education material provided: Food label handouts, A1C conversion sheet,  Meal plan card, My Plate, Snack sheet and Support group flyer, ADA Diabetes book, Eating out tips  If problems or questions, patient to contact team via:  Phone  Future DSME appointment: 6 months

## 2017-12-25 MED FILL — FREESTYLE LIBRE 14 DAY SENS: 28 days supply | Qty: 2 | Fill #2

## 2018-01-16 MED FILL — FREESTYLE LIBRE 14 DAY SENS: 28 days supply | Qty: 2 | Fill #3

## 2018-01-18 ENCOUNTER — Other Ambulatory Visit (INDEPENDENT_AMBULATORY_CARE_PROVIDER_SITE_OTHER): Payer: 59

## 2018-01-18 DIAGNOSIS — E1065 Type 1 diabetes mellitus with hyperglycemia: Secondary | ICD-10-CM

## 2018-01-18 LAB — LIPID PANEL
Cholesterol: 114 mg/dL (ref 0–200)
HDL: 25 mg/dL — AB (ref >39.00)
LDL CALC: 66 mg/dL (ref 0–99)
NONHDL: 88.51
Total CHOL/HDL Ratio: 5
Triglycerides: 111 mg/dL (ref 0.0–149.0)
VLDL: 22.2 mg/dL (ref 0.0–40.0)

## 2018-01-18 LAB — HEMOGLOBIN A1C: Hgb A1c MFr Bld: 6.5 % (ref 4.6–6.5)

## 2018-01-18 LAB — GLUCOSE, RANDOM: GLUCOSE: 151 mg/dL — AB (ref 70–99)

## 2018-01-21 NOTE — Progress Notes (Signed)
Patient ID: Jesse Johnson, male   DOB: 1991/02/01, 27 y.o.   MRN: [id number]           Reason for Appointment: Follow-up for Type 1 Diabetes  Referring physician: Merri Brunette   History of Present Illness:          Date of diagnosis of type 1 diabetes mellitus: 11/2016       Background history:   In early 2018 he was starting to have symptoms of weakness, increased urination and thirst, blurred vision and weight loss He checked her home blood sugar was 400+ and at his physician's office it was 541 with an A1c of 8.6 Initial labs did not show acidosis He was started on Synjardy at that time with this he thinks his blood sugar started getting better A1c was down to 6.6 done in 01/2017 Subsequently because of insurance difficulties he could not afford this and was tried on metformin and glipizide but blood sugar started going up progressively after this.  Also at some point had tried Trulicity which caused GI side effects  Recent history:    INSULIN regimen: Tresiba 25 units in the morning daily, Humalog 6-8 units before meals   Most recent A1c is 6.5 compared to 10.3 previously    He will try to use carbohydrate counting as an estimate for his Humalog and for now we will start with 1: 10 coverage  He will need to take another 2 to 5 units for higher fat meals  Probably take 10 to 12 units Humalog to try and cover his breakfast and also inject 10 to 15 minutes before the meal in the morning  He will reduce his mealtime dose to 50% when planning to be very active  Also if his pre-meal blood sugar is low normal he can take the insulin right after eating  Discussed appropriate treatment of hypoglycemia, to avoid candy bars and use glucose tablets and regular Coke   Current management, blood sugar patterns and problems identified:   He appears to have lower blood sugars overall compared to his last visit about 6 weeks ago  He has probably not changed his diet very much even  though he has seen the nutritionist  However he still has significant fluctuation in his blood sugars at all times including overnight  About a week or so ago because of tendency to low sugars overnight he reduced his [place] from 30 down to 25 units  However again the last 4 nights or so he has had some tendency to low sugars occasionally  However FASTING readings before breakfast are not as low  He appears to have more consistently HIGH readings after breakfast and after any other meal  Because of his fear of hypoglycemia he has taken only 4 units of HUMALOG with most of his meals  He was told to start carbohydrate counting but he is not clearly doing this, was told to try 1: 15 coverage for his meals  POSTPRANDIAL readings can be quite variable  He says that because of his increased activity in the afternoon at work he frequently will not take any insulin to cover his meal and most of the time blood sugars did not go up until at least 3-4 PM Only occasionally has significantly high readings after evening meal, averaging about 180 at 10 PM HYPOGLYCEMIA is occurring most often overnight but occasionally before lunch also recently He thinks his freestyle Josephine Igo is possibly a little lower than expected blood sugars on  his fingerstick  Glucose monitoring:  done 2 times a day         Glucometer:  Freestyle libre      Blood Glucose averages on download  CGM use % of time  90  Average and SD  150+/-68  Time in range      56 %, was 33  % Time Above 180  31  % Time above 250   % Time Below target  13    Mean values apply above for all meters except median for One Touch  PRE-MEAL Fasting Lunch Dinner Bedtime Overall  Glucose range:       Mean/median:  122  133   152  150   POST-MEAL PC Breakfast PC Lunch PC Dinner  Glucose range:     Mean/median:  194  165  165   Previous blood sugars:   Mean values apply above for all meters except median for One Touch  PRE-MEAL Fasting  Lunch Dinner Bedtime Overall  Glucose range:      46-423  Mean/median:  181  217  227  269  228   POST-MEAL PC Breakfast PC Lunch PC Dinner  Glucose range:     Mean/median:  284  246      Self-care: The diet that the patient has been following is: tries to limit sweet tea and drinks only one time per day.    Typical meal intake: Breakfast is generally biscuits or liver pudding sandwich Snacks frequently during the day and evening               Dietician visit, most recent: None CDU visit: 10/2017               Exercise:minimal, he works as a Production designer, theatre/television/filmmaintenance person for [employer]  Weight history:  Wt Readings from Last 3 Encounters:  01/22/18 186 lb 9.6 oz (84.6 kg)  12/10/17 183 lb (83 kg)  12/07/17 182 lb (82.6 kg)    Glycemic control:   Lab Results  Component Value Date   HGBA1C 6.5 01/18/2018   Lab Results  Component Value Date   LDLCALC 66 01/18/2018   CREATININE 0.84 12/04/2017   No results found for: MICRALBCREAT  Lab Results  Component Value Date   FRUCTOSAMINE 430 (H) 12/04/2017      Allergies as of 01/22/2018   No Known Allergies     Medication List        Accurate as of 01/22/18  2:15 PM. Always use your most recent med list.          FREESTYLE LIBRE 14 DAY SENSOR Misc 1 Units by Does not apply route every 14 (fourteen) days.   insulin degludec 100 UNIT/ML Sopn FlexTouch Pen Commonly known as:  TRESIBA FLEXTOUCH Inject 0.3 mLs (30 Units total) into the skin daily.   insulin lispro 100 UNIT/ML KiwkPen Commonly known as:  HUMALOG KWIKPEN Inject daily max dose of 30 units per day       Allergies: No Known Allergies  Past Medical History:  Diagnosis Date  . Diabetes mellitus without complication (HCC)     No past surgical history on file.  Family History  Problem Relation Age of Onset  . Diabetes Neg Hx   . Heart disease Neg Hx     Social History:  reports that he has been smoking cigarettes.  He has never used smokeless  tobacco. He reports that he drinks alcohol. He reports that he has current or past drug history.  Drug: Marijuana.   Review of Systems   Lipid history: No labs available from his PCP    Lab Results  Component Value Date   CHOL 114 01/18/2018   HDL 25.00 (L) 01/18/2018   LDLCALC 66 01/18/2018   TRIG 111.0 01/18/2018   CHOLHDL 5 01/18/2018           Hypertension: Not present  BP Readings from Last 3 Encounters:  01/22/18 124/72  12/07/17 110/68  10/16/17 118/72    Most recent foot exam: 10/2017  TSH normal in 11/2016  LABS:  Lab on 01/18/2018  Component Date Value Ref Range Status  . Cholesterol 01/18/2018 114  0 - 200 mg/dL Final   ATP III Classification       Desirable:  < 200 mg/dL               Borderline High:  200 - 239 mg/dL          High:  > = 829 mg/dL  . Triglycerides 01/18/2018 111.0  0.0 - 149.0 mg/dL Final   Normal:  <562 mg/dLBorderline High:  150 - 199 mg/dL  . HDL 01/18/2018 25.00* >39.00 mg/dL Final  . VLDL 13/03/6577 22.2  0.0 - 40.0 mg/dL Final  . LDL Cholesterol 01/18/2018 66  0 - 99 mg/dL Final  . Total CHOL/HDL Ratio 01/18/2018 5   Final                  Men          Women1/2 Average Risk     3.4          3.3Average Risk          5.0          4.42X Average Risk          9.6          7.13X Average Risk          15.0          11.0                      . NonHDL 01/18/2018 88.51   Final   NOTE:  Non-HDL goal should be 30 mg/dL higher than patient's LDL goal (i.e. LDL goal of < 70 mg/dL, would have non-HDL goal of < 100 mg/dL)  . Glucose, Bld 01/18/2018 151* 70 - 99 mg/dL Final  . Hgb I6N MFr Bld 01/18/2018 6.5  4.6 - 6.5 % Final   Glycemic Control Guidelines for People with Diabetes:Non Diabetic:  <6%Goal of Therapy: <7%Additional Action Suggested:  >8%     Physical Examination:  BP 124/72   Pulse 88   Wt 186 lb 9.6 oz (84.6 kg)   SpO2 92%   BMI 23.02 kg/m            ASSESSMENT:  Diabetes type 1, uncontrolled     See history of present  illness for detailed discussion of current diabetes management, blood sugar patterns and problems identified  His A1c is better at 6.5 However he still has significant variability in blood sugars at all times Most of his variability is related to increased activity level at times and tendency to hypoglycemia with this However he is probably not taking enough insulin to cover most of his meals at least in the evening because of tendency to hypoglycemia Also appears to be taking with the same amount of Humalog at meals regardless of pre-meal blood sugar and what he is  eating With this his blood sugars are usually high after breakfast even with taking insulin 30 minutes prior to eating Appears to be less sensitive to insulin in the morning Also because of improved insulin sensitivity with better control over the last few weeks he is now requiring less basal insulin also  More recently again has started tendency to overnight hypoglycemia with Evaristo Bury although some of this is still possibly delayed reaction to increase physical activity in the evenings Discussed day-to-day management of diabetes and effects of diet and exercise and various types of foods  PLAN:    And needs to increase his coverage for breakfast, most likely needs 6 units regardless of what he is planning to do in the morning  Also needs to use carbohydrate counting for adjusting his suppertime mealtime coverage and add 2 to 4 units more for higher fat meals  May still need to have mealtime coverage at lunch if planning to be less active  Reduce TRESIBA down to 22 units and adjust every week or so based on overnight blood sugar patterns  Again discussed the function and advantages of the Medtronic 670 pump although may also have the option of the T-slim pump  Discussed importance of using sensor information to regulating basal rates to reduce variability and hypoglycemia.  He will check on the coverage for this and start this  when able to  Patient Instructions  Extra 2-4 units for hi fat and cover dinner 1 unit per 15g carbs  22 Tresiba for now, adjust based on overnite sugars    Counseling time on subjects discussed in assessment and plan sections is over 50% of today's 25 minute visit  01/22/2018, 2:15 PM   Note: This office note was prepared with Dragon voice recognition system technology. Any transcriptional errors that result from this process are unintentional.

## 2018-01-22 ENCOUNTER — Ambulatory Visit: Payer: 59 | Admitting: Endocrinology

## 2018-01-22 ENCOUNTER — Encounter: Payer: Self-pay | Admitting: Endocrinology

## 2018-01-22 VITALS — BP 124/72 | HR 88 | Wt 186.6 lb

## 2018-01-22 DIAGNOSIS — E1065 Type 1 diabetes mellitus with hyperglycemia: Secondary | ICD-10-CM

## 2018-01-22 NOTE — Patient Instructions (Addendum)
Extra 2-4 units for hi fat and cover dinner 1 unit per 15g carbs  22 Tresiba for now, adjust based on overnite sugars

## 2018-01-23 ENCOUNTER — Other Ambulatory Visit: Payer: Self-pay | Admitting: Endocrinology

## 2018-01-23 MED FILL — TRESIBA FLEXTOUCH 100 UNITS: 100 | 49 days supply | Qty: 15 | Fill #0

## 2018-02-11 ENCOUNTER — Telehealth: Payer: Self-pay | Admitting: Endocrinology

## 2018-02-11 NOTE — Telephone Encounter (Signed)
[facility] has paperwork for pt.

## 2018-02-11 NOTE — Telephone Encounter (Signed)
[employer] is calling in regards to paperwork that was faxed over for patients pump,. They would like to make sure we have received this. And when faxed back please send office notes.      FAX- [phone]

## 2018-02-13 MED FILL — FREESTYLE LIBRE 14 DAY SENS: 28 days supply | Qty: 2 | Fill #4

## 2018-02-19 ENCOUNTER — Telehealth: Payer: Self-pay

## 2018-02-19 NOTE — Telephone Encounter (Signed)
Qudus from [employer] is calling to check the status of CMN and office notes for this patient- please call him back at [phone] just fax this paperwork to [phone]

## 2018-02-19 NOTE — Telephone Encounter (Signed)
[employer] rep came into office and was given pt's CMN. Paperwork was originally faxed on 02/06/18 and fax confirmation was received.

## 2018-03-04 ENCOUNTER — Other Ambulatory Visit: Payer: Self-pay | Admitting: Endocrinology

## 2018-03-22 DIAGNOSIS — E1065 Type 1 diabetes mellitus with hyperglycemia: Secondary | ICD-10-CM | POA: Diagnosis not present

## 2018-04-02 ENCOUNTER — Telehealth: Payer: Self-pay

## 2018-04-02 NOTE — Telephone Encounter (Signed)
Patient called today requesting an appointment for pump training as soon as possible because he is almost out of pen insulin

## 2018-04-05 MED FILL — TRESIBA FLEXTOUCH 100 UNITS: 100 | 80 days supply | Qty: 12 | Fill #0

## 2018-04-05 MED FILL — HUMALOG 100 UNITS/ML KWIKPE: 100 | 50 days supply | Qty: 15 | Fill #1

## 2018-04-09 ENCOUNTER — Telehealth: Payer: Self-pay | Admitting: Nutrition

## 2018-04-09 NOTE — Telephone Encounter (Signed)
Message left to call me to schedule his pump start. 

## 2018-04-16 ENCOUNTER — Encounter: Payer: 59 | Attending: Endocrinology | Admitting: Nutrition

## 2018-04-16 DIAGNOSIS — E1065 Type 1 diabetes mellitus with hyperglycemia: Secondary | ICD-10-CM

## 2018-04-16 NOTE — Progress Notes (Signed)
Jesse Johnson was trained on the Medtronic 670G insulin pump settings were put into the pump by the patient, per Dr. Remus Blake order:  Basal rate: 0.6 from 6AM to 10PM, and 0.8 from 10PM to 6AM, I/C: 10, ISF: 50, Timing: 4 hours, and target:  Day: 120, night: 140. He forgot and took his long-acting insulin this AM at 8:00.  His pump was put in a 100% basal reduction mode until 7AM tomorrow morning.  He did this some assistance from me. He inserted a Mio infusion set into his left upper abdomen without difficulty.  and he practiced giving a bolus, and did this correctly,  His blood sugar was 250, and he did a correction dose of 2.1u .  He had no final questions. He was also trained on how to put on the sensor, and calibrate the sensor.  He put a sensor on with little assistance from me, and the sensor showed that it was connected to the pump.  We also discussed how/when to calibrate the sensor, and he reported good understanding of this.  He had no final questions.

## 2018-04-17 ENCOUNTER — Ambulatory Visit (INDEPENDENT_AMBULATORY_CARE_PROVIDER_SITE_OTHER): Payer: 59 | Admitting: Endocrinology

## 2018-04-17 ENCOUNTER — Encounter: Payer: 59 | Admitting: Nutrition

## 2018-04-17 ENCOUNTER — Encounter: Payer: Self-pay | Admitting: Endocrinology

## 2018-04-17 VITALS — BP 100/56 | HR 69 | Ht 75.5 in | Wt 182.4 lb

## 2018-04-17 DIAGNOSIS — E1065 Type 1 diabetes mellitus with hyperglycemia: Secondary | ICD-10-CM

## 2018-04-17 NOTE — Progress Notes (Signed)
Patient ID: Jesse Johnson, male   DOB: 12/18/1990, 27 y.o.   MRN: [id number]           Reason for Appointment: Follow-up for Type 1 Diabetes  Referring physician: Merri Brunette   History of Present Illness:          Date of diagnosis of type 1 diabetes mellitus: 11/2016       Background history:   In early 2018 he was starting to have symptoms of weakness, increased urination and thirst, blurred vision and weight loss He checked her home blood sugar was 400+ and at his physician's office it was 541 with an A1c of 8.6 Initial labs did not show acidosis He was started on Synjardy at that time with this he thinks his blood sugar started getting better A1c was down to 6.6 done in 01/2017 Subsequently because of insurance difficulties he could not afford this and was tried on metformin and glipizide but blood sugar started going up progressively after this.  Also at some point had tried Trulicity which caused GI side effects  Recent history:      INSULIN pump settings:  BASAL rate = 0.6 at midnight and 0.8 at 6 AM Carbohydrate coverage 1: 10, correction 1: 50 and target 120 with active insulin 4 hours  PREVIOUS INSULIN regimen: Tresiba 22 units in the morning daily, Humalog 6-8 units before meals   Most recent A1c is 6.5 compared to 10.3 previously   Current management, blood sugar patterns and problems identified:   He was started on the insulin pump yesterday  However he had already taken about 12 to 15 units of his Tresiba in the morning before he realized he was supposed to not take this  Also even though his basal rate was supposed to be on suspend this continue to be effective throughout the day without any excessive hypoglycemia  Blood sugars yesterday were as follows  Glucose before lunch was 183  He took 2 units for his lunch but subsequently was outside very active and his blood sugar was low normal causing his pump to suspend for up to 3 hours  intermittently  However at suppertime he did not bolus because he thought he would be active again and his blood sugar went up to 236  With a correction bolus of 2.3 units blood sugar was down to 84 and his pump suspended again  He had a snack without coverage and blood sugar rebounded to just over 12:200  Subsequently blood sugar came down to 128 when he checked fasting readings and pump had again suspended before low  Also he had 1 of his sensors follow-up yesterday when he was sweating He is continuing to get diabetes education today from the nurses regarding his pump management  Glucose monitoring:  done 4 + times a day         Glucometer:  Contour/guardian sensor      Previous CGM data:  PRE-MEAL Fasting Lunch Dinner Bedtime Overall  Glucose range:       Mean/median:  122  133   152  150   POST-MEAL PC Breakfast PC Lunch PC Dinner  Glucose range:     Mean/median:  194  165  165     Self-care: The diet that the patient has been following is: tries to limit sweet tea and drinks only one time per day.    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 Always use your most recent med list.           FREESTYLE LIBRE 14 DAY SENSOR Misc APPLY 1 SENSOR EVERY 14 DAYS   FREESTYLE LIBRE 14 DAY SENSOR Misc APPLY 1 SENSOR EVERY 14 DAYS   insulin lispro 100 UNIT/ML KiwkPen Commonly known as:  HUMALOG Inject daily max dose of 30 units per day   TRESIBA FLEXTOUCH 100 UNIT/ML Sopn FlexTouch Pen Generic drug:  insulin degludec INJECT 30 UNITS INTO THE SKIN DAILY.       Allergies: No Known Allergies  Past Medical History:  Diagnosis Date  . Diabetes mellitus without complication (HCC)     No past surgical history on file.  Family History  Problem Relation Age of Onset  . Diabetes Neg Hx   . Heart disease Neg Hx     Social History:  reports that he has been smoking cigarettes. He has never used smokeless tobacco. He reports that he drinks alcohol. He reports that he has current or past drug history. Drug: Marijuana.   Review of Systems   Lipid history: No labs available from his PCP    Lab Results  Component Value Date   CHOL 114 01/18/2018   HDL 25.00 (L) 01/18/2018   LDLCALC 66 01/18/2018   TRIG 111.0 01/18/2018   CHOLHDL 5 01/18/2018           Hypertension: Not present  BP Readings from Last 3 Encounters:  04/17/18 (!) 100/56  01/22/18 124/72  12/07/17 110/68    Most recent foot exam: 10/2017  TSH normal in 11/2016  LABS:  No visits with results within 1 Week(s) from this visit.  Latest known visit with results is:  Lab on 01/18/2018  Component Date Value Ref Range Status  . Cholesterol 01/18/2018 114  0 - 200 mg/dL Final   ATP III Classification       Desirable:  < 200 mg/dL               Borderline High:  200 - 239 mg/dL          High:  > = 492 mg/dL  . Triglycerides 01/18/2018 111.0  0.0 - 149.0 mg/dL Final   Normal:  <010 mg/dLBorderline High:  150 - 199 mg/dL  . HDL 01/18/2018 25.00* >39.00 mg/dL Final  . VLDL 02/22/1974 22.2  0.0 - 40.0 mg/dL Final  . LDL Cholesterol 01/18/2018 66  0 - 99 mg/dL Final  . Total CHOL/HDL Ratio 01/18/2018 5    Final  Men          Women1/2 Average Risk     3.4          3.3Average Risk          5.0          4.42X Average Risk          9.6          7.13X Average Risk          15.0          11.0                      . NonHDL 01/18/2018 88.51   Final   NOTE:  Non-HDL goal should be 30 mg/dL higher than patient's LDL goal (i.e. LDL goal of < 70 mg/dL, would have non-HDL goal of < 100 mg/dL)  . Glucose, Bld 01/18/2018 151* 70 - 99 mg/dL Final  . Hgb Z6X MFr Bld 01/18/2018 6.5  4.6 - 6.5 % Final   Glycemic Control Guidelines for People with Diabetes:Non Diabetic:  <6%Goal of Therapy: <7%Additional Action Suggested:  >8%     Physical Examination:  BP (!) 100/56 (BP Location: Right Arm, Patient Position: Sitting, Cuff Size: Normal)   Pulse 69   Ht 6' 3.5" (1.918 m)   Wt 182 lb 6.4 oz (82.7 kg)   SpO2 98%   BMI 22.50 kg/m            ASSESSMENT:  Diabetes type 1, on basal bolus insulin  His A1c on the last visit was better at 6.5  He is starting an insulin pump because of marked variability of his blood sugars, he is being fairly physically active at times and for better control overall  However with starting the pump he did not completely hold his basal insulin yesterday morning and not clear if his settings are appropriate at this time He does appear to have a relatively lower blood sugar trend early morning before 6 AM Difficult to know what his carbohydrate coverage should be since he did not have enough boluses to assess at mealtimes This morning however his breakfast bolus did not adequately cover his blood sugar  PLAN:    Reduce basal rate to 0.5 at midnight  Try correction of 1: 60 instead of 50  He will try to be more consistent with carbohydrate entry at all meals and use 1: 15 ratio for today although may need 1: 10 at breakfast, this will depend on his type of food intake  He can do a temporary basal before starting to be active  Continue pump management  education today with nurse educator  Will consider using the auto mode next week  To reassess his management tomorrow  There are no Patient Instructions on file for this visit.   Counseling time on subjects discussed in assessment and plan sections is over 50% of today's 25 minute visit   04/17/2018, 2:59 PM   Note: This office note was prepared with Insurance underwriter. Any transcriptional errors that result from this process are unintentional.

## 2018-04-18 ENCOUNTER — Ambulatory Visit: Payer: 59 | Admitting: Endocrinology

## 2018-04-18 DIAGNOSIS — Z0289 Encounter for other administrative examinations: Secondary | ICD-10-CM

## 2018-04-21 NOTE — Patient Instructions (Signed)
Calibrate the sensor when instructed by the pump, and then once again in the next 4 hours.  Then Calibrate every 8 hours.   Read over the book on CGM and pump

## 2018-04-21 NOTE — Patient Instructions (Signed)
Read over manuals, and sign up for [employer]link.

## 2018-04-21 NOTE — Progress Notes (Signed)
  He reported that he had to replace the sensor because it fell off his arm.  He reinserted a new one without any difficulty.  He reported no difficulty giving boluses, or calibrating the sensor We reviewed temp basal rates-when and how to use them, sick days and high blood sugar protocol.  He signed off the checklist as understanding all topics and had no final questions.  He was told to call for an appointment in 2 weeks to start auto mode.  He agreed to do this, and had no final questions. He does not have a computer at home, but does use his phone. He was told sign up for carelink, so that he will be able to download his pump, if he is having problems away from home, if he can use a computer at [employer] or friend's home.  He agreed to do this.

## 2018-04-23 ENCOUNTER — Telehealth: Payer: Self-pay | Admitting: Endocrinology

## 2018-04-23 MED ORDER — GLUCOSE BLOOD VI STRP: ORAL_STRIP | 3 refills | Status: DC

## 2018-04-23 NOTE — Telephone Encounter (Signed)
Patients wife stated that pt needed strips sent into the pharmacy .  He has just recently seen linda for his pump start. When patients wife spoke with [employer] they advised her to call our office an have them sent.     [facility] - [place], [place] - [address] [place]

## 2018-04-23 NOTE — Telephone Encounter (Signed)
Sent!

## 2018-04-26 ENCOUNTER — Ambulatory Visit: Payer: 59 | Admitting: Endocrinology

## 2018-05-08 ENCOUNTER — Telehealth: Payer: Self-pay | Admitting: Endocrinology

## 2018-05-08 NOTE — Telephone Encounter (Signed)
We can start only when he can be 100% sure he can keep his appointments

## 2018-05-08 NOTE — Telephone Encounter (Signed)
Patient missed 2 pump start appointments-wants to reschedule. When should we schedule his missed appointments. [phone]

## 2018-05-08 NOTE — Telephone Encounter (Signed)
Please advise 

## 2018-05-08 NOTE — Telephone Encounter (Signed)
[facility] is needing a prescription sent to them for the Humalog Vials instead of the JacksonKwikpen since the patient is on a pump.      [facility] [place] - [address]

## 2018-05-09 MED ORDER — INSULIN LISPRO 100 UNIT/ML ~~LOC~~ SOLN: SUBCUTANEOUS | 0 refills | Status: DC

## 2018-05-09 MED FILL — HumaLOG 100 UNIT/ML SOLN: 100 | 90 days supply | Qty: 30 | Fill #0

## 2018-05-09 NOTE — Telephone Encounter (Signed)
Changed to vials and sent in for pt

## 2018-05-10 NOTE — Telephone Encounter (Signed)
[facility]-Plz see [facility] response about appt/thx dmf

## 2018-06-07 ENCOUNTER — Encounter: Payer: 59 | Attending: Endocrinology | Admitting: Dietician

## 2018-06-07 DIAGNOSIS — E1065 Type 1 diabetes mellitus with hyperglycemia: Secondary | ICD-10-CM | POA: Diagnosis not present

## 2018-06-07 NOTE — Progress Notes (Addendum)
Diabetes Self-Management Education  Visit Type:  Follow-up  Appt. Start Time: 1530 Appt. End Time: 1600  06/10/2018  [name], identified by name and date of birth, is a 27 y.o. male with a diagnosis of Diabetes: Type 1  .    ASSESSMENT Patient is here today alone.  He has the Medtronic insulin pump and CGM for the past 6 weeks.  He has not been counting carbohydrates and usually uses the pump in manual mode and states that he has gotten used to how much insulin he needs and that if he is active he can eat any thing (burger, fries, and regular drink) and he still goes low in the afternoon.  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 States that he changed the 6 am-12 am basal rate to 1 unit per hour as he got better control with this.  Now due to lows he states that he is planning on moving this back to 0.8 units per hour and monitoring his blood sugar with this change.  I discussed that he needs to have an appointment with Bonita Quin and Dr. Lucianne Muss.  Discussed that different settings can be entered for weekends vs week days.   He is decreasing his smoking from 1 pack per day to 1/3- 1/2 per day and is working on quitting.   A1C 6.5% 01/18/18 Weight 175 lbs decreased from 183 lbs 12/10/17.  Patient thinks this is due to being more physically active with his current job.  Blood glucose waking 80-160.  Lows often in the afternoon at work. Diet hx: Breakfast:  Bacon, eggs, waffles OR sausage biscuit Lunch:  Adriana Simas out OR Mrs. Winner's OR [employer] OR Subway OR ham and cheese sandwich from home Dinner:  Wife cooks- Chicken, mashed potatoes, cabbage  Discussed healthier options when out to eat.  Weight 175 lb (79.4 kg). Body mass index is 21.58 kg/m.   Diabetes Self-Management Education - 06/10/18 1226      Psychosocial Assessment   Patient Belief/Attitude about Diabetes  Motivated to manage diabetes     Self-management support  Doctor's office;Family    Patient Concerns  Nutrition/Meal planning;Glycemic Control    Special Needs  None    Preferred Learning Style  No preference indicated    Learning Readiness  Not Ready      Pre-Education Assessment   Patient understands the diabetes disease and treatment process.  Demonstrates understanding / competency    Patient understands incorporating nutritional management into lifestyle.  Needs Review    Patient undertands incorporating physical activity into lifestyle.  Demonstrates understanding / competency    Patient understands using medications safely.  Demonstrates understanding / competency    Patient understands monitoring blood glucose, interpreting and using results  Demonstrates understanding / competency    Patient understands prevention, detection, and treatment of acute complications.  Demonstrates understanding / competency    Patient understands prevention, detection, and treatment of chronic complications.  Demonstrates understanding / competency    Patient understands how to develop strategies to address psychosocial issues.  Demonstrates understanding / competency    Patient understands how to develop strategies to promote health/change behavior.  Needs Review      Complications   Last HgB A1C per patient/outside source  6.5 %   01/18/18   How often do you check your blood sugar?  > 4 times/day   CGM  Dietary Intake   Breakfast  bacon, eggs, waffles OR sausage biscuit    Lunch  Adriana Simas out or Mrs. Winners OR [employer] OR [employer]way OR aham and cheese sandwich and chips from home    Dinner  cabbage, chicken mashed potatoes      Patient Education   Previous Diabetes Education  Yes (please comment)   6 months ago   Nutrition management   Information on hints to eating out and maintain blood glucose control.;Meal options for control of blood glucose level and chronic complications.;Carbohydrate counting    Physical activity and  exercise   Identified with patient nutritional and/or medication changes necessary with exercise.    Medications  Reviewed patients medication for diabetes, action, purpose, timing of dose and side effects.      Individualized Goals (developed by patient)   Nutrition  General guidelines for healthy choices and portions discussed    Reducing Risk  examine blood glucose patterns    Health Coping  discuss diabetes with (comment)   MD, RD, CDE     Patient Self-Evaluation of Goals - Patient rates self as meeting previously set goals (% of time)   Nutrition  50 - 75 %    Medications  >75%    Monitoring  >75%    Problem Solving  >75%    Reducing Risk  50 - 75 %    Health Coping  >75%      Post-Education Assessment   Patient understands the diabetes disease and treatment process.  Demonstrates understanding / competency    Patient understands incorporating nutritional management into lifestyle.  Demonstrates understanding / competency    Patient undertands incorporating physical activity into lifestyle.  Demonstrates understanding / competency    Patient understands using medications safely.  Demonstrates understanding / competency    Patient understands monitoring blood glucose, interpreting and using results  Demonstrates understanding / competency    Patient understands prevention, detection, and treatment of acute complications.  Demonstrates understanding / competency    Patient understands prevention, detection, and treatment of chronic complications.  Demonstrates understanding / competency    Patient understands how to develop strategies to address psychosocial issues.  Demonstrates understanding / competency    Patient understands how to develop strategies to promote health/change behavior.  Demonstrates understanding / competency      Outcomes   Program Status  Completed       Learning Objective:  Patient will have a greater understanding of diabetes self-management. Patient  education plan is to attend individual and/or group sessions per assessed needs and concerns.   Plan:   Patient Instructions  Put the Calorie Brooke Dare app in your phone. Consider healthier food choices when you eat fast food.    Make an appointment with Bonita Quin and Dr. Lucianne Muss.    Expected Outcomes:  Demonstrated interest in learning. Expect positive outcomes  Education material provided:  Eating out tips  If problems or questions, patient to contact team via:  Phone  Future DSME appointment: - PRN.

## 2018-06-07 NOTE — Patient Instructions (Addendum)
Put the Calorie Brooke Dare app in your phone. Consider healthier food choices when you eat fast food.    Make an appointment with Bonita Quin and Dr. Lucianne Muss.

## 2018-06-10 ENCOUNTER — Encounter: Payer: Self-pay | Admitting: Dietician

## 2018-06-19 ENCOUNTER — Encounter: Payer: 59 | Admitting: Nutrition

## 2018-07-02 ENCOUNTER — Encounter: Payer: 59 | Attending: Endocrinology | Admitting: Nutrition

## 2018-07-02 DIAGNOSIS — E1065 Type 1 diabetes mellitus with hyperglycemia: Secondary | ICD-10-CM | POA: Insufficient documentation

## 2018-07-03 NOTE — Progress Notes (Signed)
Patient was frustrated with all of the problems with the sensor and calibrations needed.  He did not want to change any of the alarm settings for this.  We reviewed the need to calibrate q 8 hours, because now everything runs of this sensor reading.  He reported good understanding of this. He trained on how to use the auto mode via a slide presentation.  He was put into automode without any difficulty. His pump was downloaded, and several things were noted.   1.  Pt. Not bolusing before the meal.  It was stressed that the auto mode will not work well, if he does not bolus before meals.  He agreed to do this. 2. Blood sugars go high after supper, and remain high all night. He reports that exercise and other things-like above have caused most of the high readings.   Pt. Reports that when he uses his right abdomen for his infusion sets, the blood sugars go high.  He was shown alternate sites to use on the right side and he agreed to try theses.   He will see Dr. Lucianne MussKumar in 2 weeks.

## 2018-07-03 NOTE — Patient Instructions (Addendum)
Take bolus before the meals. Put in all carbs eaten, including snacks Calibrate the sensor every 8 hours. Do what the pump tells you to do.

## 2018-07-09 ENCOUNTER — Telehealth: Payer: Self-pay | Admitting: Nutrition

## 2018-07-09 NOTE — Telephone Encounter (Signed)
Message left on phone to see how he is doing in the AutoMode.  Left number to call me if having difficulty.

## 2018-07-19 DIAGNOSIS — E1065 Type 1 diabetes mellitus with hyperglycemia: Secondary | ICD-10-CM | POA: Diagnosis not present

## 2018-07-21 NOTE — Progress Notes (Signed)
Patient ID: Jesse Johnson, male   DOB: 07/04/1991, 27 y.o.   MRN: [id number]           Reason for Appointment: Follow-up for Type 1 Diabetes  Referring physician: Merri Brunette   History of Present Illness:          Date of diagnosis of type 1 diabetes mellitus: 11/2016       Background history:   In early 2018 he was starting to have symptoms of weakness, increased urination and thirst, blurred vision and weight loss He checked her home blood sugar was 400+ and at his physician's office it was 541 with an A1c of 8.6 Initial labs did not show acidosis He was started on Synjardy at that time with this he thinks his blood sugar started getting better A1c was down to 6.6 done in 01/2017 Subsequently because of insurance difficulties he could not afford this and was tried on metformin and glipizide but blood sugar started going up progressively after this.  Also at some point had tried Trulicity which caused GI side effects  Recent history:      INSULIN pump: MEDTRONIC 670 with following settings:  BASAL rate = 0.6 at midnight and 0.8 at 6 AM Carbohydrate coverage 1: 15, correction 1: 60 and target 120 with active insulin 4 hours   Most recent A1c is 7.6, previously was 6.5   Current management, blood sugar patterns from pump download and problems identified:   He has been in the auto mode for the last 3 weeks or so after instructions from the nurse educator  He says his main difficulty is trying to balance tendency to low blood sugars with his work activity and the amount of bolus he has to take  HIGHEST blood sugars overall are after breakfast and in the evenings after about 6-7 PM  With this he still has 16% of his readings are above 250  This is likely causing him to exit the auto mode about 9 times in the last 2 weeks  He is currently not using the temporary target of 150 when he is more active at work which is not consistent throughout the day  He is occasionally having  difficulties with the transmitter asking for repeat blood sugar after calibration   Glucose monitoring:  done 4 + times a day         Glucometer:  Contour/guardian sensor       CONTINUOUS GLUCOSE MONITORING RECORD INTERPRETATION    Dates of Recording:   Sensor description:  Results statistics:   CGM use % of time  35  Average and SD  179+/-68  Time in range       56 %  % Time Above 180  27  % Time above 250  18  % Time Below target 1    Glycemic patterns summary:   Hyperglycemic episodes are documented after breakfast and in the evenings before and after dinner until late at night  Hypoglycemic episodes are not documented with only 1 relatively low blood sugar at about 1 PM and also a couple of days ago early morning, the latter was in the manual mode  Overnight periods: Blood sugars start off high over 200 at midnight on average blood gradually decline until about 6 AM without hypoglycemia.  Variability more significant when not in auto mode  Preprandial periods: Blood sugars are high at the time of breakfast frequently but not when he wakes up This may be partly because of  his drinking coffee and not taking the correction Also in the morning may not be bolusing before he starts eating On an average blood sugars are variably mildly at lunchtime which is usually 12-1 PM and at dinnertime and 8 PM his blood sugars are frequently over 200 both on weekdays and weekends  Postprandial periods: After breakfast: Blood sugars are significantly high averaging about 228 On an average blood sugar go up about 60 mg after lunch:    After dinner: Blood sugars are relatively flat although significantly variable, being mostly high before eating   PRE-MEAL Fasting Lunch Dinner Bedtime Overall  Glucose range:       Mean/median:    232     POST-MEAL PC Breakfast PC Lunch PC Dinner  Glucose range:     Mean/median:    220    Previous CGM data:  PRE-MEAL Fasting Lunch Dinner Bedtime  Overall  Glucose range:       Mean/median:  122  133   152  150   POST-MEAL PC Breakfast PC Lunch PC Dinner  Glucose range:     Mean/median:  194  165  165     Self-care: The diet that the patient has been following is: tries to limit sweet tea and drinks only one time per day.    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 Always use your most recent med list.          glucose blood test strip Use as instructed to test 3 times daily for pump   insulin lispro 100 UNIT/ML injection Commonly known as:  HUMALOG Use 30 units daily in insulin pump       Allergies: No Known Allergies  Past Medical History:  Diagnosis Date  . Diabetes mellitus without complication (HCC)     History reviewed. No pertinent surgical history.  Family History  Problem Relation Age of Onset  . Diabetes Neg Hx   . Heart disease Neg Hx     Social History:  reports that he has been smoking cigarettes. He has never used smokeless tobacco. He reports that he drinks alcohol. He reports that he has current or past drug history. Drug: Marijuana.   Review of Systems   Lipid history: No  labs available from his PCP    Lab Results  Component Value Date   CHOL 114 01/18/2018   HDL 25.00 (L) 01/18/2018   LDLCALC 66 01/18/2018  TRIG 111.0 01/18/2018   CHOLHDL 5 01/18/2018           Hypertension: Not present  BP Readings from Last 3 Encounters:  07/22/18 102/70  04/17/18 (!) 100/56  01/22/18 124/72    Most recent foot exam: 10/2017  TSH normal in 11/2016  LABS:  Office Visit on 07/22/2018  Component Date Value Ref Range Status  . Hemoglobin A1C 07/22/2018 7.6* 4.0 - 5.6 % Final    Physical Examination:  BP 102/70 (BP Location: Left Arm, Patient Position: Sitting, Cuff Size: Normal)   Pulse 81   Ht 6' 3.5" (1.918 m)   Wt 175 lb (79.4 kg)   SpO2 97%   BMI 21.58 kg/m            ASSESSMENT:  Diabetes type 1, on Medtronic 670 pump  His A1c on the last visit was better at 6.5 but is now up to 7.6  See history of present illness for detailed discussion of current diabetes management, blood sugar patterns and problems identified  Although his blood sugar may be relatively less labile with the auto mode of the pump he is still having significant hyperglycemia at times Factors responsible for his high sugars are discussed above He likely can do better with covering his meals with adequate insulin He is holding back the doses frequently because of fear of hypoglycemia Also not using the temporary target to counteract any decrease in blood sugar when he is active He can try to also bolus more appropriately for his breakfast and coffee in the morning when he has not been bolusing enough and on time Currently has no consistent pattern of the blood sugars tend to be the highest before and after supper which may be partly diet related recently   PLAN:    No change in basic settings including active insulin time required  With his manual mode he needs to use the lower basal rate of 0.6 until 7 AM since he had a low sugar a couple of days ago in the  morning  Use of temporary target as discussed above  More bolusing at a time before eating instead of after  Make sure he boluses with 15 g of carbohydrate entry for his coffee in the morning  He can start the temporary target of 150 when getting to work and use it during the day as needed when he is more active  Consider increasing suppertime carbohydrate ratio if he has more consistently high readings  He will call Medtronic to see if he needs a new transmitter which will reduce the need for frequent CBG for calibration  Influenza vaccine given, discussed importance of this vaccine with his history of diabetes  There are no Patient Instructions on file for this visit.   Counseling time on subjects discussed in assessment and plan sections is over 50% of today's 25 minute visit   07/22/2018, 12:36 PM   Note: This office note was prepared with Insurance underwriterDragon voice recognition system technology. Any transcriptional errors that result from this process are unintentional.

## 2018-07-22 ENCOUNTER — Encounter: Payer: Self-pay | Admitting: Endocrinology

## 2018-07-22 ENCOUNTER — Ambulatory Visit (INDEPENDENT_AMBULATORY_CARE_PROVIDER_SITE_OTHER): Payer: 59 | Admitting: Endocrinology

## 2018-07-22 VITALS — BP 102/70 | HR 81 | Ht 75.5 in | Wt 175.0 lb

## 2018-07-22 DIAGNOSIS — E1065 Type 1 diabetes mellitus with hyperglycemia: Secondary | ICD-10-CM | POA: Diagnosis not present

## 2018-07-22 DIAGNOSIS — Z23 Encounter for immunization: Secondary | ICD-10-CM | POA: Diagnosis not present

## 2018-07-22 LAB — POCT GLYCOSYLATED HEMOGLOBIN (HGB A1C): Hemoglobin A1C: 7.6 % — AB (ref 4.0–5.6)

## 2018-07-30 ENCOUNTER — Other Ambulatory Visit: Payer: Self-pay | Admitting: Endocrinology

## 2018-07-30 MED FILL — HumaLOG 100 UNIT/ML SOLN: 100 | 90 days supply | Qty: 30 | Fill #0

## 2018-09-17 DIAGNOSIS — E1065 Type 1 diabetes mellitus with hyperglycemia: Secondary | ICD-10-CM | POA: Diagnosis not present

## 2018-09-30 ENCOUNTER — Other Ambulatory Visit: Payer: Self-pay | Admitting: Endocrinology

## 2018-10-11 MED FILL — HumaLOG 100 UNIT/ML SOLN: 100 | 90 days supply | Qty: 30 | Fill #0

## 2018-10-17 DIAGNOSIS — E1065 Type 1 diabetes mellitus with hyperglycemia: Secondary | ICD-10-CM | POA: Diagnosis not present

## 2018-11-26 ENCOUNTER — Ambulatory Visit: Payer: 59 | Admitting: Endocrinology

## 2018-12-03 ENCOUNTER — Other Ambulatory Visit: Payer: Self-pay | Admitting: Endocrinology

## 2018-12-13 ENCOUNTER — Telehealth: Payer: Self-pay

## 2018-12-13 ENCOUNTER — Other Ambulatory Visit: Payer: Self-pay | Admitting: Endocrinology

## 2018-12-13 NOTE — Telephone Encounter (Signed)
Overdue for an appt. LVM requesting returned call. 

## 2018-12-17 ENCOUNTER — Other Ambulatory Visit: Payer: Self-pay

## 2018-12-17 ENCOUNTER — Encounter: Payer: Self-pay | Admitting: Endocrinology

## 2018-12-17 ENCOUNTER — Ambulatory Visit: Payer: 59 | Admitting: Endocrinology

## 2018-12-17 VITALS — BP 120/68 | HR 59 | Ht 75.5 in | Wt 177.6 lb

## 2018-12-17 DIAGNOSIS — E1065 Type 1 diabetes mellitus with hyperglycemia: Secondary | ICD-10-CM | POA: Diagnosis not present

## 2018-12-17 LAB — POCT GLYCOSYLATED HEMOGLOBIN (HGB A1C): Hemoglobin A1C: 7.4 % — AB (ref 4.0–5.6)

## 2018-12-17 LAB — BASIC METABOLIC PANEL
BUN: 13 mg/dL (ref 6–23)
CO2: 29 mEq/L (ref 19–32)
Calcium: 9 mg/dL (ref 8.4–10.5)
Chloride: 102 mEq/L (ref 96–112)
Creatinine, Ser: 0.98 mg/dL (ref 0.40–1.50)
GFR: 90.32 mL/min (ref >60.00)
Glucose, Bld: 113 mg/dL — ABNORMAL HIGH (ref 70–99)
Potassium: 3.8 mEq/L (ref 3.5–5.1)
Sodium: 137 mEq/L (ref 135–145)

## 2018-12-17 LAB — MICROALBUMIN / CREATININE URINE RATIO
Creatinine,U: 250.4 mg/dL
Microalb Creat Ratio: 1.9 mg/g (ref 0.0–30.0)
Microalb, Ur: 4.8 mg/dL — ABNORMAL HIGH (ref 0.0–1.9)

## 2018-12-17 NOTE — Patient Instructions (Addendum)
After 8 pm carb ratio 1: 12  Bolus just before the meal

## 2018-12-17 NOTE — Progress Notes (Signed)
Patient ID: Jesse Johnson, male   DOB: 16-Apr-1991, 28 y.o.   MRN: [id number]           Reason for Appointment: Follow-up for Type 1 Diabetes  Referring physician: Merri Brunette   History of Present Illness:          Date of diagnosis of type 1 diabetes mellitus: 11/2016       Background history:   In early 2018 he was starting to have symptoms of weakness, increased urination and thirst, blurred vision and weight loss He checked her home blood sugar was 400+ and at his physician's office it was 541 with an A1c of 8.6 Initial labs did not show acidosis He was started on Synjardy at that time with this he thinks his blood sugar started getting better A1c was down to 6.6 done in 01/2017 Subsequently because of insurance difficulties he could not afford this and was tried on metformin and glipizide but blood sugar started going up progressively after this.  Also at some point had tried Trulicity which caused GI side effects  Recent history:      INSULIN pump: MEDTRONIC 670 with following settings:  BASAL rate = 0.6 at midnight and 0.8 at 6 AM Carbohydrate coverage 1: 15, correction 1: 60 and target 120 with active insulin 4 hours   His A1c is about the same at 7.4 compared to 7.6   Current management, blood sugar patterns from pump download and problems identified:   He has been in the auto mode 82% of the time  Problems identified today as follows:  Has periodic hyperglycemia at times which is more significant late in the evening and may be related to inadequate boluses or delayed boluses  However he says that the infusion set may not work well at times and he has to move it.  However also appears to be changing his infusion set every 4 to 5 days but not showing up as a tubing fill more than twice in the last 2 weeks  He does not also fill his cannula when he is changing the infusion set  He is concerned about potential for hypoglycemia during work when he is active and may  be bolusing less than the actual amount needed which may or may not be able to control his readings especially after lunch  He thinks that blood sugar will start going up before dinnertime when he is coming back from work and his basal rate is relatively lower  Also appears to be having the same carbohydrate ratio throughout the day but he has no change in pattern seen  He does have issues with not calibrating on time sometimes and this may cause auto mode exit  Overall hypoglycemia has been minimal, once transiently around 7 PM and once 10 PM   Glucose monitoring:  done 4 + times a day         Glucometer:  Contour/guardian sensor      CGM use % of time  78  2-week average/SD  176+/-61  Time in range       58 %, was 56  % Time Above 180  28  % Time above 250  13  % Time Below 70  1     PRE-MEAL Fasting Lunch Dinner  overnight AC/PC Overall  Glucose range:  121-303      Averages:  203  207  216  263/193  176   POST-MEAL PC Breakfast PC Lunch PC Dinner  Glucose  range:     Averages:  147  166  207    Self-care: The diet that the patient has been following is: tries to limit sweet tea and drinks only one time per day.    Typical meal intake: Breakfast is generally biscuits Snacks during the day and evening               Dietician visit, most recent: None CDU visit: 10/2017               Exercise:minimal, he works as a Production designer, theatre/television/film person for [employer]  Weight history:  Wt Readings from Last 3 Encounters:  07/22/18 175 lb (79.4 kg)  06/10/18 175 lb (79.4 kg)  04/17/18 182 lb 6.4 oz (82.7 kg)    Glycemic control:   Lab Results  Component Value Date   HGBA1C 7.6 (A) 07/22/2018   HGBA1C 6.5 01/18/2018   Lab Results  Component Value Date   LDLCALC 66 01/18/2018   CREATININE 0.84 12/04/2017   No results found for: [facility]  Lab Results  Component Value Date   FRUCTOSAMINE 430 (H) 12/04/2017      Allergies as of 12/17/2018   No Known Allergies      Medication List       Accurate as of Dec 17, 2018  9:03 AM. Always use your most recent med list.        glucose blood test strip Commonly known as:  Contour Next Test Use as instructed to test 3 times daily for pump   insulin lispro 100 UNIT/ML injection Commonly known as:  HumaLOG INJECT 30 UNITS ONCE A DAY VIA INSULIN PUMP       Allergies: No Known Allergies  Past Medical History:  Diagnosis Date  . Diabetes mellitus without complication (HCC)     No past surgical history on file.  Family History  Problem Relation Age of Onset  . Diabetes Neg Hx   . Heart disease Neg Hx     Social History:  reports that he has been smoking cigarettes. He has never used smokeless tobacco. He reports current alcohol use. He reports current drug use. Drug: Marijuana.   Review of Systems   Lipid history: No labs available from his PCP    Lab Results  Component Value Date   CHOL 114 01/18/2018   HDL 25.00 (L) 01/18/2018   LDLCALC 66 01/18/2018   TRIG 111.0 01/18/2018   CHOLHDL 5 01/18/2018           Hypertension: Not present  BP Readings from Last 3 Encounters:  07/22/18 102/70  04/17/18 (!) 100/56  01/22/18 124/72    Most recent foot exam: 10/2017  TSH normal in 11/2016  LABS:  No visits with results within 1 Week(s) from this visit.  Latest known visit with results is:  Office Visit on 07/22/2018  Component Date Value Ref Range Status  . Hemoglobin A1C 07/22/2018 7.6* 4.0 - 5.6 % Final    Physical Examination:  There were no vitals taken for this visit.           ASSESSMENT:  Diabetes type 1, on Medtronic 670 pump  His A1c is still about the same at 7.4, previously 7.6  See history of present illness for detailed discussion of current diabetes management, blood sugar patterns and problems identified  He has not been able to use his sensor consistently and less than 80% of the time Also not clear if he is having issues with his infusion set  at times  and whether the malfunction is related to not feeling his cannula, properly filling his tubing or site rotation  Blood sugars fluctuate before and after meals throughout the day Generally blood sugars are lower during the day when he is more active and the highest late in the evening However average blood sugars before and after meals show no consistent rise or excessive hypoglycemia with his boluses He is able to adjust his boluses based on his planned activity level but this may be inconsistent from day-to-day   PLAN:    Try coverage of evening meals after 8 PM with 1: 12 carbohydrate ratio when he tends to have higher readings  At present no need to change correction factor from an assessment of his correction boluses  Needs to be consistent with bolusing before starting to eat  Also cover snacks consistently and also coffee in the morning since his blood sugar at breakfast time tends to be higher at times  Will have the nurse educator discuss issues with his infusion set  Urine microalbumin to be checked today  There are no Patient Instructions on file for this visit.   Counseling time on subjects discussed in assessment and plan sections is over 50% of today's 25 minute visit   12/17/2018, 9:03 AM   Note: This office note was prepared with Insurance underwriterDragon voice recognition system technology. Any transcriptional errors that result from this process are unintentional.

## 2018-12-24 ENCOUNTER — Other Ambulatory Visit: Payer: Self-pay

## 2018-12-24 MED ORDER — GLUCOSE BLOOD VI STRP: ORAL_STRIP | 3 refills | Status: AC

## 2018-12-24 MED ORDER — INSULIN LISPRO 100 UNIT/ML ~~LOC~~ SOLN: SUBCUTANEOUS | 0 refills | Status: DC

## 2018-12-24 MED FILL — HumaLOG 100 UNIT/ML SOLN: 100 | 84 days supply | Qty: 30 | Fill #0

## 2019-02-17 ENCOUNTER — Telehealth: Payer: Self-pay | Admitting: Endocrinology

## 2019-02-17 NOTE — Telephone Encounter (Signed)
Noted  

## 2019-02-17 NOTE — Telephone Encounter (Signed)
MEDICATION: humalog   PHARMACY:    IS THIS A 90 DAY SUPPLY : yes  IS PATIENT OUT OF MEDICATION: n  IF NOT; HOW MUCH IS LEFT: has a week left  LAST APPOINTMENT DATE: @5 /07/2019  NEXT APPOINTMENT DATE:@9 /08/2018  DO WE HAVE YOUR PERMISSION TO LEAVE A DETAILED MESSAGE:  OTHER COMMENTS:    **Let patient know to contact pharmacy at the end of the day to make sure medication is ready. **  ** Please notify patient to allow 48-72 hours to process**  **Encourage patient to contact the pharmacy for refills or they can request refills through [facility]**

## 2019-02-17 NOTE — Telephone Encounter (Signed)
Pt is also letting us know to keep and eye out for [employer] contact for update on the order for the device

## 2019-02-21 ENCOUNTER — Other Ambulatory Visit: Payer: Self-pay | Admitting: Endocrinology

## 2019-02-24 ENCOUNTER — Telehealth: Payer: Self-pay | Admitting: Endocrinology

## 2019-02-24 NOTE — Telephone Encounter (Signed)
[employer] called in regards to faxing over documents for patients Humalog device. Will be refaxing today to [phone].  Please Advise, Thanks

## 2019-02-24 NOTE — Telephone Encounter (Signed)
Faxed to [employer]

## 2019-02-27 DIAGNOSIS — E1065 Type 1 diabetes mellitus with hyperglycemia: Secondary | ICD-10-CM | POA: Diagnosis not present

## 2019-03-05 MED FILL — HumaLOG 100 UNIT/ML SOLN: 100 | 84 days supply | Qty: 30 | Fill #0

## 2019-04-15 ENCOUNTER — Other Ambulatory Visit: Payer: 59

## 2019-04-18 ENCOUNTER — Ambulatory Visit: Payer: 59 | Admitting: Endocrinology

## 2019-05-14 ENCOUNTER — Other Ambulatory Visit: Payer: Self-pay

## 2019-05-14 ENCOUNTER — Telehealth: Payer: Self-pay | Admitting: Endocrinology

## 2019-05-14 DIAGNOSIS — E1065 Type 1 diabetes mellitus with hyperglycemia: Secondary | ICD-10-CM

## 2019-05-14 MED ORDER — INSULIN LISPRO 100 UNIT/ML ~~LOC~~ SOLN: 30.0000 [IU] | SUBCUTANEOUS | 0 refills | Status: DC

## 2019-05-14 NOTE — Telephone Encounter (Signed)
MEDICATION: insulin lispro (HUMALOG) 100 UNIT/ML injection  PHARMACY:   [facility], [place] [phone] (Phone) [phone] (Fax)    IS THIS A 90 DAY SUPPLY : Yes  IS PATIENT OUT OF MEDICATION: No  IF NOT; HOW MUCH IS LEFT: 3 days  LAST APPOINTMENT DATE: @9 /08/2018  NEXT APPOINTMENT DATE:@10 /14/2020  DO WE HAVE YOUR PERMISSION TO LEAVE A DETAILED MESSAGE:Yes  OTHER COMMENTS:    **Let patient know to contact pharmacy at the end of the day to make sure medication is ready. **  ** Please notify patient to allow 48-72 hours to process**  **Encourage patient to contact the pharmacy for refills or they can request refills through [facility]**

## 2019-05-14 NOTE — Telephone Encounter (Signed)
insulin lispro (HUMALOG) 100 UNIT/ML injection 9 mL 0 05/14/2019    Sig - Route: Inject 0.3 mLs (30 Units total) into the skin See admin instructions. INJECT 30 UNITS ONCE A DAY VIA INSULIN PUMP - Subcutaneous   Sent to pharmacy as: insulin lispro (HUMALOG) 100 UNIT/ML injection   Notes to Pharmacy: Needs appt to determine new dosage   E-Prescribing Status: Receipt confirmed by pharmacy (05/14/2019 8:41 AM EDT)

## 2019-05-23 MED FILL — HumaLOG 100 UNIT/ML SOLN: 100 | 33 days supply | Qty: 10 | Fill #0

## 2019-05-28 ENCOUNTER — Other Ambulatory Visit: Payer: Self-pay

## 2019-05-28 ENCOUNTER — Other Ambulatory Visit (INDEPENDENT_AMBULATORY_CARE_PROVIDER_SITE_OTHER): Payer: 59

## 2019-05-28 DIAGNOSIS — E1065 Type 1 diabetes mellitus with hyperglycemia: Secondary | ICD-10-CM

## 2019-05-28 LAB — GLUCOSE, RANDOM: Glucose, Bld: 370 mg/dL — ABNORMAL HIGH (ref 70–99)

## 2019-05-28 LAB — HEMOGLOBIN A1C: Hgb A1c MFr Bld: 7.9 % — ABNORMAL HIGH (ref 4.6–6.5)

## 2019-06-02 ENCOUNTER — Ambulatory Visit (INDEPENDENT_AMBULATORY_CARE_PROVIDER_SITE_OTHER): Payer: 59 | Admitting: Endocrinology

## 2019-06-02 ENCOUNTER — Encounter: Payer: Self-pay | Admitting: Endocrinology

## 2019-06-02 ENCOUNTER — Other Ambulatory Visit: Payer: Self-pay

## 2019-06-02 VITALS — BP 118/68 | HR 83 | Ht 75.5 in | Wt 175.8 lb

## 2019-06-02 DIAGNOSIS — E1065 Type 1 diabetes mellitus with hyperglycemia: Secondary | ICD-10-CM | POA: Diagnosis not present

## 2019-06-02 NOTE — Progress Notes (Signed)
Patient ID: Jesse Johnson, male   DOB: [birth year], 28 y.o.   MRN: [id number]           Reason for Appointment: Follow-up for Type 1 Diabetes  Referring physician: Deland Pretty   History of Present Illness:          Date of diagnosis of type 1 diabetes mellitus: 11/2016       Background history:   In early 2018 he was starting to have symptoms of weakness, increased urination and thirst, blurred vision and weight loss He checked her home blood sugar was 400+ and at his physician's office it was 541 with an A1c of 8.6 Initial labs did not show acidosis He was started on Synjardy at that time with this he thinks his blood sugar started getting better A1c was down to 6.6 done in 01/2017 Subsequently because of insurance difficulties he could not afford this and was tried on metformin and glipizide but blood sugar started going up progressively after this.  Also at some point had tried Trulicity which caused GI side effects  Recent history:      INSULIN pump: [employer] 670 with following settings:  BASAL rate = 0.8 at midnight and 0.9 at 6 AM Carbohydrate coverage 1: 15, including trials after 8 PM, correction 1: 60 and target 120 with active insulin 4 hours      Current management, blood sugar patterns from pump download and problems identified: His A1c is slightly higher at 7.9 compared to 7.4    He has been in the auto mode 77 % of the time  Problems identified today are outlined below:  Has periodic hyperglycemia at times partly related to infusion site malfunction  Also some of his high readings are related to delayed boluses after starting the meal  Also tends to have higher readings late evening after evening meal but this is variable  He thinks he is not eating high-fat meals in the evening  During the day he is tending to have low normal sugars at times and may not bolusing off at breakfast because of fear of hypoglycemia while at work   CONTINUOUS GLUCOSE  MONITORING RECORD INTERPRETATION    Dates of Recording: 10/16 through 10/19  Sensor description: Guardian  Results statistics:   CGM use % of time  80  Average and SD  196+/-172  Time in range    54    %  % Time Above 180  27  % Time above 250  18  % Time Below target  1    Glycemic patterns summary: His blood sugar patterns are indicating significant variability but also significant hypoglycemia midmorning in late evening.  Hypoglycemia has been occurring twice, apparently from overcorrection  Hyperglycemic episodes are occurring around breakfast time along with dawn phenomenon and also late evening after about 10 PM.  However not clearly occurring postprandially Several episodes are related to infusion set malfunction  Hypoglycemic episodes occurred twice, at least once related to overcorrection after going in the manual mode and once likely with increased activity  Overnight periods: Blood sugars are mostly starting significantly high late at night around bedtime gradually improving until 6 AM  Preprandial periods: Blood sugars are rising after waking up and usually higher at breakfast.  Blood sugars generally only mildly increased at lunch.  Although blood sugars are high at dinnertime they appear to be rising before the meal  Postprandial periods:   After breakfast: Blood sugars are usually going up significantly although  they appear to be rising just before the meal After lunch: Blood sugars are mostly running from the level after the meal with occasional significant high readings also  After dinner: Blood sugars are rising before the meal and there is significant variability postprandial but on an average blood sugars are flat   PRE-MEAL  AC breakfast Lunch Dinner  10 PM-6 AM AC/PC Overall  Glucose range:       Mean/median:  227  162  199  283/205  186   POST-MEAL PC Breakfast PC Lunch PC Dinner  Glucose range:     Mean/median:  201  189  199    Previous data:       CGM use % of time  78  2-week average/SD  176+/-61  Time in range       58 %, was 56  % Time Above 180  28  % Time above 250  13  % Time Below 70  1     Self-care: The diet that the patient has been following is: tries to limit sweet tea and drinks only one time per day.    Typical meal intake: Breakfast is generally biscuits Snacks during the day and evening               Dietician visit, most recent: None CDU visit: 10/2017               Exercise:minimal, he works as a Production designer, theatre/television/film person for [employer]  Weight history:  Wt Readings from Last 3 Encounters:  12/17/18 177 lb 9.6 oz (80.6 kg)  07/22/18 175 lb (79.4 kg)  06/10/18 175 lb (79.4 kg)    Glycemic control:   Lab Results  Component Value Date   HGBA1C 7.9 (H) 05/28/2019   HGBA1C 7.4 (A) 12/17/2018   HGBA1C 7.6 (A) 07/22/2018   Lab Results  Component Value Date   MICROALBUR 4.8 (H) 12/17/2018   LDLCALC 66 01/18/2018   CREATININE 0.98 12/17/2018   Lab Results  Component Value Date   MICRALBCREAT 1.9 12/17/2018    Lab Results  Component Value Date   FRUCTOSAMINE 430 (H) 12/04/2017      Allergies as of 06/02/2019   No Known Allergies     Medication List       Accurate as of June 02, 2019  1:56 PM. If you have any questions, ask your nurse or doctor.        glucose blood test strip Commonly known as: Contour Next Test Use as instructed to test 3 times daily for pump   insulin lispro 100 UNIT/ML injection Commonly known as: HumaLOG Inject 0.3 mLs (30 Units total) into the skin See admin instructions. INJECT 30 UNITS ONCE A DAY VIA INSULIN PUMP       Allergies: No Known Allergies  Past Medical History:  Diagnosis Date  . Diabetes mellitus without complication (HCC)     No past surgical history on file.  Family History  Problem Relation Age of Onset  . Diabetes Neg Hx   . Heart disease Neg Hx     Social History:  reports that he has been smoking cigarettes. He has  never used smokeless tobacco. He reports current alcohol use. He reports current drug use. Drug: Marijuana.   Review of Systems   Lipid history: No labs available from his PCP    Lab Results  Component Value Date   CHOL 114 01/18/2018   HDL 25.00 (L) 01/18/2018   LDLCALC 66 01/18/2018  TRIG 111.0 01/18/2018   CHOLHDL 5 01/18/2018           Hypertension: Not present  BP Readings from Last 3 Encounters:  12/17/18 120/68  07/22/18 102/70  04/17/18 (!) 100/56    Most recent foot exam: 10/2017  TSH normal in 11/2016  LABS:  Lab on 05/28/2019  Component Date Value Ref Range Status  . Glucose, Bld 05/28/2019 370* 70 - 99 mg/dL Final  . Hgb Z6XA1c MFr Bld 05/28/2019 7.9* 4.6 - 6.5 % Final   Glycemic Control Guidelines for People with Diabetes:Non Diabetic:  <6%Goal of Therapy: <7%Additional Action Suggested:  >8%     Physical Examination:  There were no vitals taken for this visit.           ASSESSMENT:  Diabetes type 1, on [employer] 670 pump  His A1c is 7.9  See history of present illness for detailed discussion of current diabetes management, blood sugar patterns and problems identified  His A1c is higher even though his blood sugar time within range is about the same He likely has much more hyperglycemia at times related to infusion site issues Also continues to have postprandial hyperglycemia as discussed above  He does have some difficulties with trying to bolus ahead of time when eating Also still concerned about potential for low sugars while he is active at work although this is not occurring partly because of getting more snacks   PLAN:    He will discuss using a different infusion set with nurse educator  Carbohydrate coverage 1: 12 starting at 7 PM instead of 8  With bolus on waking up in the morning on workdays so that he does not have higher sugars at the time of the meal  When he arrives at work he evidently was in a temporary target of 150 so  that he can avoid tendency to low sugars and excessive snacks  Consistently bolus before the start of the meal especially in the evening  He will check with pump company regarding issues with his transmitter not charging and other issues with sensor and infusion sites  We will also see if he can upgrade to the 770 pump  No change in insulin to carbohydrate ratio  For now we will continue active insulin time of 4 hours since do not see any consistent rise in blood sugar at 3 hours but this may change if he boluses early  Call if having consistent problems with hyperglycemia  Basal rate will be 0.95 at midnight, also increase basal rate at 8 PM up to 1.0     There are no Patient Instructions on file for this visit.   Counseling time on subjects discussed in assessment and plan sections is over 50% of today's 25 minute visit   06/02/2019, 1:56 PM   Note: This office note was prepared with Insurance underwriterDragon voice recognition system technology. Any transcriptional errors that result from this process are unintentional.

## 2019-06-17 ENCOUNTER — Other Ambulatory Visit: Payer: Self-pay | Admitting: Endocrinology

## 2019-06-17 DIAGNOSIS — E1065 Type 1 diabetes mellitus with hyperglycemia: Secondary | ICD-10-CM

## 2019-06-25 ENCOUNTER — Telehealth: Payer: Self-pay | Admitting: Nutrition

## 2019-06-25 NOTE — Telephone Encounter (Signed)
Message left on machine that I have several different infusion sets for him, different adhesives, and we can discuss different site options as well.  He was given my number and told to call me.

## 2019-06-27 DIAGNOSIS — E1065 Type 1 diabetes mellitus with hyperglycemia: Secondary | ICD-10-CM | POA: Diagnosis not present

## 2019-07-18 ENCOUNTER — Other Ambulatory Visit: Payer: Self-pay

## 2019-07-18 DIAGNOSIS — E1065 Type 1 diabetes mellitus with hyperglycemia: Secondary | ICD-10-CM

## 2019-07-18 MED ORDER — INSULIN LISPRO 100 UNIT/ML ~~LOC~~ SOLN: SUBCUTANEOUS | 3 refills | Status: DC

## 2019-07-18 MED FILL — HumaLOG 100 UNIT/ML SOLN: 100 | 28 days supply | Qty: 10 | Fill #0

## 2019-07-21 ENCOUNTER — Other Ambulatory Visit: Payer: Self-pay

## 2019-07-21 DIAGNOSIS — E1065 Type 1 diabetes mellitus with hyperglycemia: Secondary | ICD-10-CM

## 2019-07-21 MED ORDER — INSULIN LISPRO 100 UNIT/ML ~~LOC~~ SOLN: SUBCUTANEOUS | 3 refills | Status: DC

## 2019-08-06 MED FILL — HumaLOG 100 UNIT/ML SOLN: 100 | 50 days supply | Qty: 20 | Fill #0

## 2019-08-19 DIAGNOSIS — E1065 Type 1 diabetes mellitus with hyperglycemia: Secondary | ICD-10-CM | POA: Diagnosis not present

## 2019-08-29 ENCOUNTER — Other Ambulatory Visit: Payer: 59

## 2019-09-02 ENCOUNTER — Other Ambulatory Visit: Payer: 59

## 2019-09-05 ENCOUNTER — Ambulatory Visit: Payer: 59 | Admitting: Endocrinology

## 2019-09-26 ENCOUNTER — Other Ambulatory Visit: Payer: Self-pay

## 2019-09-26 ENCOUNTER — Telehealth: Payer: Self-pay | Admitting: Endocrinology

## 2019-09-26 ENCOUNTER — Other Ambulatory Visit (INDEPENDENT_AMBULATORY_CARE_PROVIDER_SITE_OTHER): Payer: 59

## 2019-09-26 DIAGNOSIS — E1065 Type 1 diabetes mellitus with hyperglycemia: Secondary | ICD-10-CM

## 2019-09-26 LAB — COMPREHENSIVE METABOLIC PANEL
ALT: 14 U/L (ref 0–53)
AST: 17 U/L (ref 0–37)
Albumin: 4.4 g/dL (ref 3.5–5.2)
Alkaline Phosphatase: 74 U/L (ref 39–117)
BUN: 13 mg/dL (ref 6–23)
CO2: 29 mEq/L (ref 19–32)
Calcium: 9.1 mg/dL (ref 8.4–10.5)
Chloride: 104 mEq/L (ref 96–112)
Creatinine, Ser: 0.98 mg/dL (ref 0.40–1.50)
GFR: 89.84 mL/min (ref >60.00)
Glucose, Bld: 174 mg/dL — ABNORMAL HIGH (ref 70–99)
Potassium: 3.9 mEq/L (ref 3.5–5.1)
Sodium: 139 mEq/L (ref 135–145)
Total Bilirubin: 1.2 mg/dL (ref 0.2–1.2)
Total Protein: 6.8 g/dL (ref 6.0–8.3)

## 2019-09-26 LAB — LIPID PANEL
Cholesterol: 122 mg/dL (ref 0–200)
HDL: 40.1 mg/dL (ref >39.00)
LDL Cholesterol: 71 mg/dL (ref 0–99)
NonHDL: 81.75
Total CHOL/HDL Ratio: 3
Triglycerides: 52 mg/dL (ref 0.0–149.0)
VLDL: 10.4 mg/dL (ref 0.0–40.0)

## 2019-09-26 LAB — HEMOGLOBIN A1C: Hgb A1c MFr Bld: 8 % — ABNORMAL HIGH (ref 4.6–6.5)

## 2019-09-26 MED ORDER — INSULIN LISPRO 100 UNIT/ML ~~LOC~~ SOLN: SUBCUTANEOUS | 3 refills | Status: DC

## 2019-09-26 MED FILL — HumaLOG 100 UNIT/ML SOLN: 100 | 50 days supply | Qty: 20 | Fill #0

## 2019-09-26 NOTE — Telephone Encounter (Signed)
MEDICATION: insulin lispro (HUMALOG) 100 UNIT/ML injection  PHARMACY:   [facility] - [place], [place] - [address] [place] Phone:  [phone]  Fax:  [phone]      IS THIS A 90 DAY SUPPLY : Yes  IS PATIENT OUT OF MEDICATION: ?  IF NOT; HOW MUCH IS LEFT: ?  LAST APPOINTMENT DATE: @2 /07/2020  NEXT APPOINTMENT DATE:@2 /19/2021  DO WE HAVE YOUR PERMISSION TO LEAVE A DETAILED MESSAGE: Yes  OTHER COMMENTS:    **Let patient know to contact pharmacy at the end of the day to make sure medication is ready. **  ** Please notify patient to allow 48-72 hours to process**  **Encourage patient to contact the pharmacy for refills or they can request refills through [facility]**

## 2019-09-26 NOTE — Telephone Encounter (Signed)
Rx sent 

## 2019-10-03 ENCOUNTER — Ambulatory Visit: Payer: 59 | Admitting: Endocrinology

## 2019-10-16 DIAGNOSIS — E1065 Type 1 diabetes mellitus with hyperglycemia: Secondary | ICD-10-CM | POA: Diagnosis not present

## 2019-11-10 MED FILL — HumaLOG 100 UNIT/ML SOLN: 100 | 50 days supply | Qty: 20 | Fill #1

## 2020-02-06 DIAGNOSIS — E1065 Type 1 diabetes mellitus with hyperglycemia: Secondary | ICD-10-CM | POA: Diagnosis not present

## 2020-02-09 MED FILL — HumaLOG 100 UNIT/ML SOLN: 100 | 50 days supply | Qty: 20 | Fill #3

## 2020-02-12 DIAGNOSIS — S30860A Insect bite (nonvenomous) of lower back and pelvis, initial encounter: Secondary | ICD-10-CM | POA: Diagnosis not present

## 2020-02-12 MED FILL — DOXYCYCLINE HYCLATE 100 MG: 100 | 7 days supply | Qty: 14 | Fill #0

## 2020-03-04 DIAGNOSIS — Z0000 Encounter for general adult medical examination without abnormal findings: Secondary | ICD-10-CM | POA: Diagnosis not present

## 2020-03-04 DIAGNOSIS — E1165 Type 2 diabetes mellitus with hyperglycemia: Secondary | ICD-10-CM | POA: Diagnosis not present

## 2020-03-10 DIAGNOSIS — E109 Type 1 diabetes mellitus without complications: Secondary | ICD-10-CM | POA: Diagnosis not present

## 2020-03-10 LAB — HM DIABETES EYE EXAM

## 2020-03-29 MED FILL — HumaLOG 100 UNIT/ML SOLN: 100 | 50 days supply | Qty: 20 | Fill #1

## 2020-04-06 ENCOUNTER — Encounter: Payer: Self-pay | Admitting: *Deleted

## 2020-04-09 ENCOUNTER — Encounter: Payer: Self-pay | Admitting: *Deleted

## 2020-05-12 MED FILL — HumaLOG 100 UNIT/ML SOLN: 100 | 50 days supply | Qty: 20 | Fill #2

## 2020-05-24 DIAGNOSIS — E1065 Type 1 diabetes mellitus with hyperglycemia: Secondary | ICD-10-CM | POA: Diagnosis not present

## 2020-07-07 MED FILL — HumaLOG 100 UNIT/ML SOLN: 100 | 50 days supply | Qty: 20 | Fill #3

## 2020-08-04 ENCOUNTER — Telehealth: Payer: Self-pay | Admitting: Endocrinology

## 2020-08-04 NOTE — Telephone Encounter (Signed)
Please advise. Patient have not been seen since 06/02/2019. Should schedule an appointment before we can do anything, correct?

## 2020-08-04 NOTE — Telephone Encounter (Signed)
His question is somewhat unclear, can discuss when he comes in next week

## 2020-08-04 NOTE — Telephone Encounter (Signed)
Patient called stating due to cost, he is asking if there is anything other than a pump that he can use? Please advise. Ph# [phone]

## 2020-08-04 NOTE — Telephone Encounter (Signed)
Message left for patient to return my call.  

## 2020-08-09 ENCOUNTER — Other Ambulatory Visit: Payer: Self-pay | Admitting: Endocrinology

## 2020-08-09 ENCOUNTER — Other Ambulatory Visit: Payer: Self-pay

## 2020-08-09 ENCOUNTER — Other Ambulatory Visit (INDEPENDENT_AMBULATORY_CARE_PROVIDER_SITE_OTHER): Payer: 59

## 2020-08-09 DIAGNOSIS — E1065 Type 1 diabetes mellitus with hyperglycemia: Secondary | ICD-10-CM

## 2020-08-09 LAB — MICROALBUMIN / CREATININE URINE RATIO
Creatinine,U: 83.8 mg/dL
Microalb Creat Ratio: 1.8 mg/g (ref 0.0–30.0)
Microalb, Ur: 1.5 mg/dL (ref 0.0–1.9)

## 2020-08-09 LAB — HEMOGLOBIN A1C: Hgb A1c MFr Bld: 7.9 % — ABNORMAL HIGH (ref 4.6–6.5)

## 2020-08-09 LAB — BASIC METABOLIC PANEL
BUN: 10 mg/dL (ref 6–23)
CO2: 32 mEq/L (ref 19–32)
Calcium: 8.8 mg/dL (ref 8.4–10.5)
Chloride: 101 mEq/L (ref 96–112)
Creatinine, Ser: 1.15 mg/dL (ref 0.40–1.50)
GFR: 85.21 mL/min (ref >60.00)
Glucose, Bld: 267 mg/dL — ABNORMAL HIGH (ref 70–99)
Potassium: 3.9 mEq/L (ref 3.5–5.1)
Sodium: 136 mEq/L (ref 135–145)

## 2020-08-10 ENCOUNTER — Other Ambulatory Visit: Payer: Self-pay

## 2020-08-10 ENCOUNTER — Encounter: Payer: Self-pay | Admitting: Emergency Medicine

## 2020-08-10 ENCOUNTER — Ambulatory Visit
Admission: EM | Admit: 2020-08-10 | Discharge: 2020-08-10 | Disposition: A | Discharge status: Home / Self Care | Payer: 59 | Attending: Emergency Medicine | Admitting: Emergency Medicine

## 2020-08-10 DIAGNOSIS — J410 Simple chronic bronchitis: Secondary | ICD-10-CM

## 2020-08-10 DIAGNOSIS — Z7689 Persons encountering health services in other specified circumstances: Secondary | ICD-10-CM | POA: Diagnosis not present

## 2020-08-10 MED ORDER — FLUTICASONE PROPIONATE 50 MCG/ACT NA SUSP: 1.0000 | Freq: Every day | NASAL | 0 refills | Status: AC

## 2020-08-10 MED ORDER — CETIRIZINE HCL 10 MG PO TABS: 10.0000 mg | ORAL_TABLET | Freq: Every day | ORAL | 0 refills | Status: AC

## 2020-08-10 MED ORDER — PREDNISONE 50 MG PO TABS: 50.0000 mg | ORAL_TABLET | Freq: Every day | ORAL | 0 refills | Status: DC

## 2020-08-10 MED ORDER — BENZONATATE 100 MG PO CAPS: 100.0000 mg | ORAL_CAPSULE | Freq: Three times a day (TID) | ORAL | 0 refills | Status: AC

## 2020-08-10 NOTE — Discharge Instructions (Signed)

## 2020-08-10 NOTE — ED Provider Notes (Signed)
[facility]    CSN: [id number] Arrival date & time: 08/10/20  1934      History   Chief Complaint Chief Complaint  Patient presents with  . Cough    HPI Jesse Johnson is a 29 y.o. male  Presenting for 3-week course of mildly productive cough without wheezing or difficulty breathing.  States that he did a home Covid test at symptom onset which was negative, though has had multiple Covid contacts within the last week.  Requesting testing.  Past Medical History:  Diagnosis Date  . Diabetes mellitus without complication [facility])     Patient Active Problem List   Diagnosis Date Noted  . Uncontrolled type 1 diabetes mellitus with hyperglycemia (HCC) 10/16/2017    History reviewed. No pertinent surgical history.     Home Medications    Prior to Admission medications   Medication Sig Start Date End Date Taking? Authorizing Provider  benzonatate (TESSALON) 100 MG capsule Take 1 capsule (100 mg total) by mouth every 8 (eight) hours. 08/10/20  Yes Hall-Potvin, [place], PA-C  cetirizine (ZYRTEC ALLERGY) 10 MG tablet Take 1 tablet (10 mg total) by mouth daily. 08/10/20  Yes Hall-Potvin, [place], PA-C  fluticasone (FLONASE) 50 MCG/ACT nasal spray Place 1 spray into both nostrils daily. 08/10/20  Yes Hall-Potvin, [place], PA-C  predniSONE (DELTASONE) 50 MG tablet Take 1 tablet (50 mg total) by mouth daily with breakfast. 08/10/20  Yes Hall-Potvin, [place], PA-C  glucose blood (CONTOUR NEXT TEST) test strip Use as instructed to test 3 times daily for pump 12/24/18   Reather Littler, MD  insulin lispro (HUMALOG) 100 UNIT/ML injection Inject a max of 0.34mL (40 units) daily via insulin pump. 09/26/19   Reather Littler, MD    Family History Family History  Problem Relation Age of Onset  . Diabetes Neg Hx   . Heart disease Neg Hx     Social History Social History   Tobacco Use  . Smoking status: Current Every Day Smoker    Types: Cigarettes  . Smokeless tobacco: Never  Used  Substance Use Topics  . Alcohol use: Yes  . Drug use: Yes    Types: Marijuana     Allergies   Patient has no known allergies.   Review of Systems Review of Systems  Constitutional: Negative for fatigue and fever.  HENT: Positive for congestion. Negative for dental problem, ear pain, facial swelling, hearing loss, sinus pain, sore throat, trouble swallowing and voice change.   Eyes: Negative for photophobia, pain and visual disturbance.  Respiratory: Positive for cough. Negative for shortness of breath and wheezing.   Cardiovascular: Negative for chest pain and palpitations.  Gastrointestinal: Negative for diarrhea and vomiting.  Musculoskeletal: Negative for arthralgias and myalgias.  Neurological: Negative for dizziness and headaches.     Physical Exam Triage Vital Signs ED Triage Vitals  Enc Vitals Group     BP      Pulse      Resp      Temp      Temp src      SpO2      Weight      Height      Head Circumference      Peak Flow      Pain Score      Pain Loc      Pain Edu?      Excl. in GC?    No data found.  Updated Vital Signs BP 110/72 (BP Location: Right Arm)  Pulse 66   Temp 98.1 F (36.7 C) (Oral)   Resp 16   SpO2 96%   Visual Acuity Right Eye Distance:   Left Eye Distance:   Bilateral Distance:    Right Eye Near:   Left Eye Near:    Bilateral Near:     Physical Exam Constitutional:      General: He is not in acute distress.    Appearance: He is not toxic-appearing or diaphoretic.  HENT:     Head: Normocephalic and atraumatic.     Right Ear: Tympanic membrane and ear canal normal.     Left Ear: Tympanic membrane and ear canal normal.     Mouth/Throat:     Mouth: Mucous membranes are moist.     Pharynx: Oropharynx is clear.  Eyes:     General: No scleral icterus.    Conjunctiva/sclera: Conjunctivae normal.     Pupils: Pupils are equal, round, and reactive to light.  Neck:     Comments: Trachea midline, negative  JVD Cardiovascular:     Rate and Rhythm: Normal rate and regular rhythm.  Pulmonary:     Effort: Pulmonary effort is normal. No respiratory distress.     Breath sounds: No wheezing.  Musculoskeletal:     Cervical back: Neck supple. No tenderness.  Lymphadenopathy:     Cervical: No cervical adenopathy.  Skin:    Capillary Refill: Capillary refill takes less than 2 seconds.     Coloration: Skin is not jaundiced or pale.     Findings: No rash.  Neurological:     Mental Status: He is alert and oriented to person, place, and time.      UC Treatments / Results  Labs (all labs ordered are listed, but only abnormal results are displayed) Labs Reviewed  NOVEL CORONAVIRUS, NAA    EKG   Radiology No results found.  Procedures Procedures (including critical care time)  Medications Ordered in UC Medications - No data to display  Initial Impression / Assessment and Plan / UC Course  I have reviewed the triage vital signs and the nursing notes.  Pertinent labs & imaging results that were available during my care of the patient were reviewed by me and considered in my medical decision making (see chart for details).     Patient afebrile, nontoxic, with SpO2 96%.  Covid PCR pending.  Patient to quarantine until results are back.  We will treat supportively as outlined below.  Will follow up with PCP for further evaluation management as needed.  Return precautions discussed, patient verbalized understanding and is agreeable to plan. Final Clinical Impressions(s) / UC Diagnoses   Final diagnoses:  Simple chronic bronchitis (HCC)     Discharge Instructions     Tessalon for cough. Start flonase, atrovent nasal spray for nasal congestion/drainage. You can use over the counter nasal saline rinse such as neti pot for nasal congestion. Keep hydrated, your urine should be clear to pale yellow in color. Tylenol/motrin for fever and pain. Monitor for any worsening of symptoms, chest pain,  shortness of breath, wheezing, swelling of the throat, go to the emergency department for further evaluation needed.     ED Prescriptions    Medication Sig Dispense Auth. Provider   benzonatate (TESSALON) 100 MG capsule Take 1 capsule (100 mg total) by mouth every 8 (eight) hours. 21 capsule Hall-Potvin, [place], PA-C   predniSONE (DELTASONE) 50 MG tablet Take 1 tablet (50 mg total) by mouth daily with breakfast. 5 tablet Hall-Potvin, [place],  PA-C   cetirizine (ZYRTEC ALLERGY) 10 MG tablet Take 1 tablet (10 mg total) by mouth daily. 30 tablet Hall-Potvin, [place], PA-C   fluticasone (FLONASE) 50 MCG/ACT nasal spray Place 1 spray into both nostrils daily. 16 g Hall-Potvin, [place], PA-C     PDMP not reviewed this encounter.   Hall-Potvin, [place], [place] 08/10/20 2208

## 2020-08-11 ENCOUNTER — Ambulatory Visit: Payer: 59 | Admitting: Endocrinology

## 2020-08-11 ENCOUNTER — Telehealth: Payer: Self-pay | Admitting: Nutrition

## 2020-08-11 NOTE — Telephone Encounter (Signed)
Message left on his voice mail  that I will need him to be on his computer when we link his pump to our computer system.  Asked him to call me back when he has 10 minutes do this. Gave him times when I am available to do this this week and next week.

## 2020-08-13 LAB — NOVEL CORONAVIRUS, NAA: SARS-CoV-2, NAA: DETECTED — AB

## 2020-08-16 ENCOUNTER — Other Ambulatory Visit: Payer: Self-pay | Admitting: Endocrinology

## 2020-08-16 ENCOUNTER — Ambulatory Visit: Payer: 59 | Admitting: Endocrinology

## 2020-08-16 ENCOUNTER — Telehealth: Payer: Self-pay | Admitting: Endocrinology

## 2020-08-16 ENCOUNTER — Other Ambulatory Visit: Payer: Self-pay | Admitting: *Deleted

## 2020-08-16 DIAGNOSIS — E1065 Type 1 diabetes mellitus with hyperglycemia: Secondary | ICD-10-CM

## 2020-08-16 MED ORDER — INSULIN LISPRO 100 UNIT/ML ~~LOC~~ SOLN: SUBCUTANEOUS | 0 refills | Status: DC

## 2020-08-16 NOTE — Telephone Encounter (Signed)
NA

## 2020-08-16 NOTE — Telephone Encounter (Signed)
REFILL REQUEST   Humalog (patient is out completely and requested just enough to last until his appt on Jan 17th)  PHARMACY  [id number] [place], [place] - [address] AT [facility] [facility] Phone:  [phone]  Fax:  [phone]

## 2020-08-16 NOTE — Telephone Encounter (Signed)
Okay to refill? 

## 2020-08-16 NOTE — Telephone Encounter (Signed)
Rx sent 

## 2020-08-16 NOTE — Telephone Encounter (Signed)
Ok to send

## 2020-08-30 ENCOUNTER — Telehealth: Payer: 59 | Admitting: Endocrinology

## 2020-08-30 ENCOUNTER — Other Ambulatory Visit: Payer: Self-pay

## 2020-08-30 ENCOUNTER — Ambulatory Visit: Payer: 59 | Admitting: Endocrinology

## 2020-09-04 MED FILL — HumaLOG 100 UNIT/ML SOLN: 100 | 25 days supply | Qty: 10 | Fill #0

## 2020-09-21 ENCOUNTER — Other Ambulatory Visit: Payer: Self-pay | Admitting: Endocrinology

## 2020-09-21 ENCOUNTER — Telehealth: Payer: Self-pay | Admitting: Endocrinology

## 2020-09-21 ENCOUNTER — Other Ambulatory Visit: Payer: Self-pay | Admitting: *Deleted

## 2020-09-21 DIAGNOSIS — E1065 Type 1 diabetes mellitus with hyperglycemia: Secondary | ICD-10-CM

## 2020-09-21 MED ORDER — INSULIN LISPRO 100 UNIT/ML ~~LOC~~ SOLN: SUBCUTANEOUS | 0 refills | Status: DC

## 2020-09-21 NOTE — Telephone Encounter (Signed)
Rx sent for a 30 day supply.  

## 2020-09-21 NOTE — Telephone Encounter (Signed)
Patient scheduled a follow up from the inclement weather cancellation.  His new appt is 10/07/20 @ 330 PM.  Patient needs Humalog called in to [facility] pharmacy because he will be out before Monday

## 2020-09-24 MED FILL — HumaLOG 100 UNIT/ML SOLN: 100 | 50 days supply | Qty: 20 | Fill #0

## 2020-09-27 ENCOUNTER — Encounter: Payer: Self-pay | Admitting: Endocrinology

## 2020-09-27 ENCOUNTER — Ambulatory Visit (INDEPENDENT_AMBULATORY_CARE_PROVIDER_SITE_OTHER): Payer: 59 | Admitting: Endocrinology

## 2020-09-27 ENCOUNTER — Other Ambulatory Visit (HOSPITAL_COMMUNITY): Payer: Self-pay | Admitting: Endocrinology

## 2020-09-27 ENCOUNTER — Other Ambulatory Visit: Payer: Self-pay

## 2020-09-27 VITALS — BP 114/76 | HR 90 | Ht 76.0 in | Wt 174.4 lb

## 2020-09-27 DIAGNOSIS — E1065 Type 1 diabetes mellitus with hyperglycemia: Secondary | ICD-10-CM

## 2020-09-27 MED ORDER — INSULIN LISPRO (1 UNIT DIAL) 100 UNIT/ML (KWIKPEN): PEN_INJECTOR | SUBCUTANEOUS | 0 refills | Status: DC

## 2020-09-27 MED ORDER — INSULIN PEN NEEDLE 31G X 5 MM MISC: 1 refills | Status: AC

## 2020-09-27 MED ORDER — TRESIBA FLEXTOUCH 100 UNIT/ML ~~LOC~~ SOPN: 22.0000 [IU] | PEN_INJECTOR | Freq: Every day | SUBCUTANEOUS | 0 refills | Status: DC

## 2020-09-27 MED FILL — TRESIBA FLEXTOUCH 100 UNITS: 100 | 26 days supply | Qty: 6 | Fill #0

## 2020-09-27 MED FILL — HUMALOG 100 UNITS/ML KWIKPE: 100 | 30 days supply | Qty: 12 | Fill #0

## 2020-09-27 MED FILL — UNIFINE PENTIPS 6MM 31G: 31G X 6 MM | 25 days supply | Qty: 100 | Fill #0

## 2020-09-27 NOTE — Patient Instructions (Addendum)
Carb ratio can try divide cabs by 10 in am plus 3 units  Supper carbs divide by 12  Tresiba 22 units daily and adjust based on am sugar

## 2020-09-27 NOTE — Progress Notes (Signed)
Patient ID: Jesse Johnson, male   DOB: Oct 23, 1990, 30 y.o.   MRN: [id number]           Reason for Appointment: Follow-up for Type 1 Diabetes  Referring physician: Merri Brunette   History of Present Illness:          Date of diagnosis of type 1 diabetes mellitus: 11/2016       Background history:   In early 2018 he was starting to have symptoms of weakness, increased urination and thirst, blurred vision and weight loss He checked her home blood sugar was 400+ and at his physician's office it was 541 with an A1c of 8.6 Initial labs did not show acidosis He was started on Synjardy at that time with this he thinks his blood sugar started getting better A1c was down to 6.6 done in 01/2017 Subsequently because of insurance difficulties he could not afford this and was tried on metformin and glipizide but blood sugar started going up progressively after this.  Also at some point had tried Trulicity which caused GI side effects  Recent history:      INSULIN pump: MEDTRONIC 670 with following settings:  BASAL rate = 0.8 at midnight and 0.9 at 6 AM Carbohydrate coverage 1: 15, goal 1: 12 after 8 PM, correction 1: 60 and target 120 with active insulin 4 hours    Current management, blood sugar patterns from pump download and problems identified: His A1c is about the same at 7.9    He has not been using the sensor for some time and has not been seen since 05/2019  He said that he is having difficulties with because of infusion sites not working more than a day also usually  He has tried to rotate his infusion sites in his abdomen and flank area as well as upper buttock but has not had as much success with this  He has not used his arms and legs because of his being physically active at work and pulling out the tubing  Because of his not using the sensor he is mostly doing fingersticks but not entering the readings in the pump difficult to know what his blood sugar patterns are  He also  feels that he ends up taking 2 to 4 units more for correcting high readings  He also feels that he has a significant dawn phenomenon and blood sugars going up on waking up; for this reason he usually will take 6 units in the morning on waking up  Generally not counting carbohydrates since he feels that he does much insulin to take 4 different meals  Previously was having readings nearly 200 most of the time except before lunchtime when they are generally lower  He does not know if he has any consistently high readings at any given time but may be higher after breakfast  Also does not appear to have frequent hypoglycemia recently  Previous data:   CGM use % of time  80  Average and SD  196+/-172  Time in range    54    %  % Time Above 180  27  % Time above 250  18  % Time Below target  1    Glycemic patterns summary: His blood sugar patterns are indicating significant variability but also significant hypoglycemia midmorning in late evening.  Hypoglycemia has been occurring twice, apparently from overcorrection   PRE-MEAL  AC breakfast Lunch Dinner  10 PM-6 AM AC/PC Overall  Glucose range:  Mean/median:  227  162  199  283/205  186   POST-MEAL PC Breakfast PC Lunch PC Dinner  Glucose range:     Mean/median:  201  189  199     Self-care: The diet that the patient has been following is: tries to limit sweet tea and drinks only one time per day.    Typical meal intake: Breakfast is generally biscuit or a sandwich                 Dietician visit, most recent: None CDU visit: 10/2017               Exercise:minimal, he works as a maintenance person    Weight history:  Wt Readings from Last 3 Encounters:  09/27/20 174 lb 6.4 oz (79.1 kg)  06/02/19 175 lb 12.8 oz (79.7 kg)  12/17/18 177 lb 9.6 oz (80.6 kg)    Glycemic control:   Lab Results  Component Value Date   HGBA1C 7.9 (H) 08/09/2020   HGBA1C 8.0 (H) 09/26/2019   HGBA1C 7.9 (H) 05/28/2019   Lab Results   Component Value Date   MICROALBUR 1.5 08/09/2020   LDLCALC 71 09/26/2019   CREATININE 1.15 08/09/2020   Lab Results  Component Value Date   MICRALBCREAT 1.8 08/09/2020    Lab Results  Component Value Date   FRUCTOSAMINE 430 (H) 12/04/2017      Allergies as of 09/27/2020      Reactions   Dulaglutide Other (See Comments)   Sitagliptin Other (See Comments)      Medication List       Accurate as of September 27, 2020  4:11 PM. If you have any questions, ask your nurse or doctor.        benzonatate 100 MG capsule Commonly known as: TESSALON Take 1 capsule (100 mg total) by mouth every 8 (eight) hours.   cetirizine 10 MG tablet Commonly known as: ZyrTEC Allergy Take 1 tablet (10 mg total) by mouth daily.   fluticasone 50 MCG/ACT nasal spray Commonly known as: FLONASE Place 1 spray into both nostrils daily.   glucose blood test strip Commonly known as: Contour Next Test Use as instructed to test 3 times daily for pump   insulin lispro 100 UNIT/ML injection Commonly known as: HumaLOG Inject a max of 0.37mL (40 units) daily via insulin pump.   predniSONE 50 MG tablet Commonly known as: DELTASONE Take 1 tablet (50 mg total) by mouth daily with breakfast.       Allergies:  Allergies  Allergen Reactions  . Dulaglutide Other (See Comments)  . Sitagliptin Other (See Comments)    Past Medical History:  Diagnosis Date  . Diabetes mellitus without complication (HCC)     No past surgical history on file.  Family History  Problem Relation Age of Onset  . Diabetes Neg Hx   . Heart disease Neg Hx     Social History:  reports that he has been smoking cigarettes. He has never used smokeless tobacco. He reports current alcohol use. He reports current drug use. Drug: Marijuana.   Review of Systems   Lipid history: No history of hypercholesterolemia    Lab Results  Component Value Date   CHOL 122 09/26/2019   HDL 40.10 09/26/2019   LDLCALC 71 09/26/2019    TRIG 52.0 09/26/2019   CHOLHDL 3 09/26/2019           Hypertension: Not present  BP Readings from Last 3 Encounters:  09/27/20 114/76  08/10/20 110/72  06/02/19 118/68    Last eye exam was in 7/21  TSH normal in 11/2016  LABS:  No visits with results within 1 Week(s) from this visit.  Latest known visit with results is:  Admission on 08/10/2020, Discharged on 08/10/2020  Component Date Value Ref Range Status  . SARS-CoV-2, NAA 08/10/2020 Detected* Not Detected Final   Comment: Patients who have a positive COVID-19 test result may now have treatment options. Treatment options are available for patients with mild to moderate symptoms and for hospitalized patients. Visit our website at [email or web address] for resources and information. This nucleic acid amplification test was developed and its performance characteristics determined by [facility]. Nucleic acid amplification tests include RT-PCR and TMA. This test has not been FDA cleared or approved. This test has been authorized by FDA under an Emergency Use Authorization (EUA). This test is only authorized for the duration of time the declaration that circumstances exist justifying the authorization of the emergency use of in vitro diagnostic tests for detection of SARS-CoV-2 virus and/or diagnosis of COVID-19 infection under section 564(b)(1) of the Act, 21 U.S.C. 829FAO-1(H360bbb-3(b) (1), unless the authorization is terminated or revoked sooner. When diagnostic testing is negativ                          e, the possibility of a false negative result should be considered in the context of a patient's recent exposures and the presence of clinical signs and symptoms consistent with COVID-19. An individual without symptoms of COVID-19 and who is not shedding SARS-CoV-2 virus would expect to have a negative (not detected) result in this assay.     Physical Examination:  BP 114/76   Pulse 90   Ht 6\' 4"   (1.93 m)   Wt 174 lb 6.4 oz (79.1 kg)   SpO2 98%   BMI 21.23 kg/m            ASSESSMENT:  Diabetes type 1, on Medtronic 670 pump  His A1c is 7.9  See history of present illness for detailed discussion of current diabetes management, blood sugar patterns and problems identified  He again has much more hyperglycemia at times related to infusion site issues and he feels that this is worsening now Since he has not used the CGM or brought in meter for download not clear what his blood sugar patterns are recently Also he does not think that he has any consistently high readings at any given time Does appear to have a dawn phenomenon by history    PLAN:    He will temporarily switch to Guinea-Bissauresiba and Humalog again  He was using 22 units of Guinea-Bissauresiba and discussed adjusting the dose every 3 or 4 days based on morning readings  He will need to try and at least estimate carbohydrates for her insulin dosing at suppertime and use 1: 12 ratio in the morning he can do a carb ratio 1:10 +2 to 3 units to cover dawn phenomenon  Continue to use 1: 15 for lunch  He will add extra for high readings  Make sure he takes insulin before starting to eat  Today discussed in detail the use of the OmniPod pump which he can use on his arms and legs much more conveniently done with infusion set.  Given him patient information brochure and showed him how this would be used.  He will check to see if how the insurance coverage will be for this  Also since he does not have a stand-alone sensor without the pump he will look into the Dexcom system and given him the information on this  More regular follow-up needed     Patient Instructions  Carb ratio can try divide cabs by 10 in am plus 3 units  Supper carbs divide by 12  Tresiba 22 units daily and adjust based on am sugar       09/27/2020, 4:11 PM   Note: This office note was prepared with Dragon voice recognition system technology. Any  transcriptional errors that result from this process are unintentional.

## 2020-09-29 DIAGNOSIS — E1065 Type 1 diabetes mellitus with hyperglycemia: Secondary | ICD-10-CM | POA: Diagnosis not present

## 2020-10-16 DIAGNOSIS — S99921A Unspecified injury of right foot, initial encounter: Secondary | ICD-10-CM | POA: Diagnosis not present

## 2020-10-16 DIAGNOSIS — F17210 Nicotine dependence, cigarettes, uncomplicated: Secondary | ICD-10-CM | POA: Diagnosis not present

## 2020-10-16 DIAGNOSIS — S0181XA Laceration without foreign body of other part of head, initial encounter: Secondary | ICD-10-CM | POA: Diagnosis not present

## 2020-10-16 DIAGNOSIS — S92112A Displaced fracture of neck of left talus, initial encounter for closed fracture: Secondary | ICD-10-CM | POA: Diagnosis not present

## 2020-10-16 DIAGNOSIS — M542 Cervicalgia: Secondary | ICD-10-CM | POA: Diagnosis not present

## 2020-10-16 DIAGNOSIS — S1981XA Other specified injuries of larynx, initial encounter: Secondary | ICD-10-CM | POA: Diagnosis not present

## 2020-10-16 DIAGNOSIS — S3993XA Unspecified injury of pelvis, initial encounter: Secondary | ICD-10-CM | POA: Diagnosis not present

## 2020-10-16 DIAGNOSIS — Y999 Unspecified external cause status: Secondary | ICD-10-CM | POA: Diagnosis not present

## 2020-10-16 DIAGNOSIS — Z720 Tobacco use: Secondary | ICD-10-CM | POA: Diagnosis not present

## 2020-10-16 DIAGNOSIS — S99922A Unspecified injury of left foot, initial encounter: Secondary | ICD-10-CM | POA: Diagnosis not present

## 2020-10-16 DIAGNOSIS — S8991XA Unspecified injury of right lower leg, initial encounter: Secondary | ICD-10-CM | POA: Diagnosis not present

## 2020-10-16 DIAGNOSIS — S8252XA Displaced fracture of medial malleolus of left tibia, initial encounter for closed fracture: Secondary | ICD-10-CM | POA: Diagnosis not present

## 2020-10-16 DIAGNOSIS — S299XXA Unspecified injury of thorax, initial encounter: Secondary | ICD-10-CM | POA: Diagnosis not present

## 2020-10-16 DIAGNOSIS — S100XXA Contusion of throat, initial encounter: Secondary | ICD-10-CM | POA: Diagnosis not present

## 2020-10-16 DIAGNOSIS — S82892A Other fracture of left lower leg, initial encounter for closed fracture: Secondary | ICD-10-CM | POA: Diagnosis not present

## 2020-10-16 DIAGNOSIS — S82201A Unspecified fracture of shaft of right tibia, initial encounter for closed fracture: Secondary | ICD-10-CM | POA: Diagnosis not present

## 2020-10-16 DIAGNOSIS — S128XXA Fracture of other parts of neck, initial encounter: Secondary | ICD-10-CM | POA: Diagnosis not present

## 2020-10-16 DIAGNOSIS — S8992XA Unspecified injury of left lower leg, initial encounter: Secondary | ICD-10-CM | POA: Diagnosis not present

## 2020-10-16 DIAGNOSIS — T1490XA Injury, unspecified, initial encounter: Secondary | ICD-10-CM | POA: Diagnosis not present

## 2020-10-16 DIAGNOSIS — S199XXA Unspecified injury of neck, initial encounter: Secondary | ICD-10-CM | POA: Diagnosis not present

## 2020-10-16 DIAGNOSIS — S0990XA Unspecified injury of head, initial encounter: Secondary | ICD-10-CM | POA: Diagnosis not present

## 2020-10-16 DIAGNOSIS — Q272 Other congenital malformations of renal artery: Secondary | ICD-10-CM | POA: Diagnosis not present

## 2020-10-16 DIAGNOSIS — S92122A Displaced fracture of body of left talus, initial encounter for closed fracture: Secondary | ICD-10-CM | POA: Diagnosis not present

## 2020-10-16 DIAGNOSIS — R1310 Dysphagia, unspecified: Secondary | ICD-10-CM | POA: Diagnosis not present

## 2020-10-16 DIAGNOSIS — Z043 Encounter for examination and observation following other accident: Secondary | ICD-10-CM | POA: Diagnosis not present

## 2020-10-16 DIAGNOSIS — J988 Other specified respiratory disorders: Secondary | ICD-10-CM | POA: Diagnosis not present

## 2020-10-16 DIAGNOSIS — S8251XA Displaced fracture of medial malleolus of right tibia, initial encounter for closed fracture: Secondary | ICD-10-CM | POA: Diagnosis not present

## 2020-10-16 DIAGNOSIS — E871 Hypo-osmolality and hyponatremia: Secondary | ICD-10-CM | POA: Diagnosis not present

## 2020-10-16 DIAGNOSIS — S82141A Displaced bicondylar fracture of right tibia, initial encounter for closed fracture: Secondary | ICD-10-CM | POA: Diagnosis not present

## 2020-10-16 DIAGNOSIS — Z794 Long term (current) use of insulin: Secondary | ICD-10-CM | POA: Diagnosis not present

## 2020-10-16 DIAGNOSIS — S3992XA Unspecified injury of lower back, initial encounter: Secondary | ICD-10-CM | POA: Diagnosis not present

## 2020-10-16 DIAGNOSIS — D62 Acute posthemorrhagic anemia: Secondary | ICD-10-CM | POA: Diagnosis not present

## 2020-10-16 DIAGNOSIS — Z930 Tracheostomy status: Secondary | ICD-10-CM | POA: Diagnosis not present

## 2020-10-16 DIAGNOSIS — Z20822 Contact with and (suspected) exposure to covid-19: Secondary | ICD-10-CM | POA: Diagnosis not present

## 2020-10-16 DIAGNOSIS — S3991XA Unspecified injury of abdomen, initial encounter: Secondary | ICD-10-CM | POA: Diagnosis not present

## 2020-10-16 DIAGNOSIS — S92142A Displaced dome fracture of left talus, initial encounter for closed fracture: Secondary | ICD-10-CM | POA: Diagnosis not present

## 2020-10-16 DIAGNOSIS — S82871A Displaced pilon fracture of right tibia, initial encounter for closed fracture: Secondary | ICD-10-CM | POA: Diagnosis not present

## 2020-10-16 DIAGNOSIS — S82101A Unspecified fracture of upper end of right tibia, initial encounter for closed fracture: Secondary | ICD-10-CM | POA: Diagnosis not present

## 2020-10-16 DIAGNOSIS — S92192A Other fracture of left talus, initial encounter for closed fracture: Secondary | ICD-10-CM | POA: Diagnosis not present

## 2020-10-16 DIAGNOSIS — S098XXA Other specified injuries of head, initial encounter: Secondary | ICD-10-CM | POA: Diagnosis not present

## 2020-10-16 DIAGNOSIS — M25571 Pain in right ankle and joints of right foot: Secondary | ICD-10-CM | POA: Diagnosis not present

## 2020-10-16 DIAGNOSIS — E1065 Type 1 diabetes mellitus with hyperglycemia: Secondary | ICD-10-CM | POA: Diagnosis not present

## 2020-10-17 DIAGNOSIS — S1981XA Other specified injuries of larynx, initial encounter: Secondary | ICD-10-CM | POA: Diagnosis not present

## 2020-10-17 DIAGNOSIS — S100XXA Contusion of throat, initial encounter: Secondary | ICD-10-CM | POA: Diagnosis not present

## 2020-10-17 DIAGNOSIS — S098XXA Other specified injuries of head, initial encounter: Secondary | ICD-10-CM | POA: Diagnosis not present

## 2020-10-17 DIAGNOSIS — S82201A Unspecified fracture of shaft of right tibia, initial encounter for closed fracture: Secondary | ICD-10-CM | POA: Diagnosis not present

## 2020-10-17 DIAGNOSIS — T1490XA Injury, unspecified, initial encounter: Secondary | ICD-10-CM | POA: Diagnosis not present

## 2020-10-17 DIAGNOSIS — J988 Other specified respiratory disorders: Secondary | ICD-10-CM | POA: Diagnosis not present

## 2020-10-17 DIAGNOSIS — S82892A Other fracture of left lower leg, initial encounter for closed fracture: Secondary | ICD-10-CM | POA: Diagnosis not present

## 2020-10-18 DIAGNOSIS — S1981XA Other specified injuries of larynx, initial encounter: Secondary | ICD-10-CM | POA: Diagnosis not present

## 2020-10-18 DIAGNOSIS — Z930 Tracheostomy status: Secondary | ICD-10-CM | POA: Diagnosis not present

## 2020-10-18 DIAGNOSIS — S82892A Other fracture of left lower leg, initial encounter for closed fracture: Secondary | ICD-10-CM | POA: Diagnosis not present

## 2020-10-18 DIAGNOSIS — T1490XA Injury, unspecified, initial encounter: Secondary | ICD-10-CM | POA: Diagnosis not present

## 2020-10-18 DIAGNOSIS — Z720 Tobacco use: Secondary | ICD-10-CM | POA: Diagnosis not present

## 2020-10-18 DIAGNOSIS — R1310 Dysphagia, unspecified: Secondary | ICD-10-CM | POA: Diagnosis not present

## 2020-10-18 DIAGNOSIS — E1065 Type 1 diabetes mellitus with hyperglycemia: Secondary | ICD-10-CM | POA: Diagnosis not present

## 2020-10-18 DIAGNOSIS — M542 Cervicalgia: Secondary | ICD-10-CM | POA: Diagnosis not present

## 2020-10-18 DIAGNOSIS — S82201A Unspecified fracture of shaft of right tibia, initial encounter for closed fracture: Secondary | ICD-10-CM | POA: Diagnosis not present

## 2020-10-19 DIAGNOSIS — S82892A Other fracture of left lower leg, initial encounter for closed fracture: Secondary | ICD-10-CM | POA: Diagnosis not present

## 2020-10-19 DIAGNOSIS — T1490XA Injury, unspecified, initial encounter: Secondary | ICD-10-CM | POA: Diagnosis not present

## 2020-10-19 DIAGNOSIS — E1065 Type 1 diabetes mellitus with hyperglycemia: Secondary | ICD-10-CM | POA: Diagnosis not present

## 2020-10-19 DIAGNOSIS — S82201A Unspecified fracture of shaft of right tibia, initial encounter for closed fracture: Secondary | ICD-10-CM | POA: Diagnosis not present

## 2020-10-19 DIAGNOSIS — S1981XA Other specified injuries of larynx, initial encounter: Secondary | ICD-10-CM | POA: Diagnosis not present

## 2020-10-20 DIAGNOSIS — S82871A Displaced pilon fracture of right tibia, initial encounter for closed fracture: Secondary | ICD-10-CM | POA: Diagnosis not present

## 2020-10-20 DIAGNOSIS — S92192A Other fracture of left talus, initial encounter for closed fracture: Secondary | ICD-10-CM | POA: Diagnosis not present

## 2020-10-21 DIAGNOSIS — E1065 Type 1 diabetes mellitus with hyperglycemia: Secondary | ICD-10-CM | POA: Diagnosis not present

## 2020-10-22 DIAGNOSIS — E1065 Type 1 diabetes mellitus with hyperglycemia: Secondary | ICD-10-CM | POA: Diagnosis not present

## 2020-10-22 DIAGNOSIS — T1490XA Injury, unspecified, initial encounter: Secondary | ICD-10-CM | POA: Diagnosis not present

## 2020-10-22 DIAGNOSIS — Z930 Tracheostomy status: Secondary | ICD-10-CM | POA: Diagnosis not present

## 2020-10-23 DIAGNOSIS — E1065 Type 1 diabetes mellitus with hyperglycemia: Secondary | ICD-10-CM | POA: Diagnosis not present

## 2020-10-25 ENCOUNTER — Other Ambulatory Visit: Payer: Self-pay | Admitting: *Deleted
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
## 2020-10-27 ENCOUNTER — Other Ambulatory Visit: Payer: Self-pay | Admitting: *Deleted
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
## 2020-11-02 ENCOUNTER — Other Ambulatory Visit: Payer: Self-pay | Admitting: *Deleted
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
## 2020-11-05 DIAGNOSIS — J387 Other diseases of larynx: Secondary | ICD-10-CM | POA: Diagnosis not present

## 2020-11-05 DIAGNOSIS — S1981XS Other specified injuries of larynx, sequela: Secondary | ICD-10-CM | POA: Diagnosis not present

## 2020-11-05 DIAGNOSIS — J383 Other diseases of vocal cords: Secondary | ICD-10-CM | POA: Diagnosis not present

## 2020-11-05 MED FILL — HUMALOG 100 UNITS/ML KWIKPE: 100 | 7 days supply | Qty: 3 | Fill #1

## 2020-11-11 DIAGNOSIS — S82892D Other fracture of left lower leg, subsequent encounter for closed fracture with routine healing: Secondary | ICD-10-CM | POA: Diagnosis not present

## 2020-11-12 ENCOUNTER — Other Ambulatory Visit: Payer: Self-pay | Admitting: Endocrinology

## 2020-11-16 ENCOUNTER — Other Ambulatory Visit (HOSPITAL_COMMUNITY): Payer: Self-pay

## 2020-11-16 ENCOUNTER — Other Ambulatory Visit: Payer: Self-pay | Admitting: Endocrinology

## 2020-11-18 ENCOUNTER — Other Ambulatory Visit: Payer: Self-pay | Admitting: Endocrinology

## 2020-11-18 ENCOUNTER — Other Ambulatory Visit (HOSPITAL_COMMUNITY): Payer: Self-pay

## 2020-11-18 ENCOUNTER — Telehealth: Payer: Self-pay | Admitting: Endocrinology
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
## 2020-11-19 ENCOUNTER — Other Ambulatory Visit (HOSPITAL_COMMUNITY): Payer: Self-pay

## 2020-11-19 ENCOUNTER — Other Ambulatory Visit: Payer: Self-pay | Admitting: Endocrinology

## 2020-11-19 DIAGNOSIS — E1065 Type 1 diabetes mellitus with hyperglycemia: Secondary | ICD-10-CM

## 2020-11-19 MED ORDER — INSULIN DEGLUDEC 100 UNIT/ML ~~LOC~~ SOPN
PEN_INJECTOR | SUBCUTANEOUS | 0 refills | Status: DC
  Filled 2020-11-19: qty 6, 27d supply, fill #0
  Filled 2020-12-23: qty 6, 27d supply, fill #1
  Filled 2021-01-18: qty 6, 27d supply, fill #2

## 2020-11-19 MED ORDER — UNIFINE PENTIPS 31G X 6 MM MISC
1 refills | Status: DC
  Filled 2020-11-19: qty 100, 25d supply, fill #0
  Filled 2020-12-22: qty 100, 25d supply, fill #1

## 2020-11-19 MED ORDER — INSULIN LISPRO (1 UNIT DIAL) 100 UNIT/ML (KWIKPEN)
PEN_INJECTOR | SUBCUTANEOUS | 0 refills | Status: DC
  Filled 2020-11-19: qty 15, 41d supply, fill #0

## 2020-11-19 NOTE — Telephone Encounter (Signed)
F/U patient calling back to check in on the message from yesterday. He only has enough insulin for today.

## 2020-11-19 NOTE — Telephone Encounter (Signed)
Sent Rx to [facility] long outpatient.

## 2020-11-23 DIAGNOSIS — Z9889 Other specified postprocedural states: Secondary | ICD-10-CM | POA: Diagnosis not present

## 2020-11-23 DIAGNOSIS — S92192D Other fracture of left talus, subsequent encounter for fracture with routine healing: Secondary | ICD-10-CM | POA: Diagnosis not present

## 2020-11-23 DIAGNOSIS — S8252XD Displaced fracture of medial malleolus of left tibia, subsequent encounter for closed fracture with routine healing: Secondary | ICD-10-CM | POA: Diagnosis not present

## 2020-11-23 DIAGNOSIS — S82891D Other fracture of right lower leg, subsequent encounter for closed fracture with routine healing: Secondary | ICD-10-CM | POA: Diagnosis not present

## 2020-11-23 DIAGNOSIS — Z888 Allergy status to other drugs, medicaments and biological substances status: Secondary | ICD-10-CM | POA: Diagnosis not present

## 2020-11-23 DIAGNOSIS — S82892D Other fracture of left lower leg, subsequent encounter for closed fracture with routine healing: Secondary | ICD-10-CM | POA: Diagnosis not present

## 2020-11-24 ENCOUNTER — Other Ambulatory Visit: Payer: Self-pay | Admitting: *Deleted
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
## 2020-12-23 ENCOUNTER — Other Ambulatory Visit (HOSPITAL_COMMUNITY): Payer: Self-pay

## 2020-12-24 ENCOUNTER — Other Ambulatory Visit: Payer: Self-pay | Admitting: Endocrinology

## 2020-12-24 DIAGNOSIS — E1065 Type 1 diabetes mellitus with hyperglycemia: Secondary | ICD-10-CM

## 2020-12-27 ENCOUNTER — Other Ambulatory Visit: Payer: 59

## 2020-12-30 ENCOUNTER — Ambulatory Visit: Payer: 59 | Admitting: Endocrinology

## 2021-01-03 DIAGNOSIS — M25571 Pain in right ankle and joints of right foot: Secondary | ICD-10-CM | POA: Diagnosis not present

## 2021-01-03 DIAGNOSIS — M25672 Stiffness of left ankle, not elsewhere classified: Secondary | ICD-10-CM | POA: Diagnosis not present

## 2021-01-03 DIAGNOSIS — R2689 Other abnormalities of gait and mobility: Secondary | ICD-10-CM | POA: Diagnosis not present

## 2021-01-03 DIAGNOSIS — M25671 Stiffness of right ankle, not elsewhere classified: Secondary | ICD-10-CM | POA: Diagnosis not present

## 2021-01-03 DIAGNOSIS — M25572 Pain in left ankle and joints of left foot: Secondary | ICD-10-CM | POA: Diagnosis not present

## 2021-01-04 DIAGNOSIS — S82891D Other fracture of right lower leg, subsequent encounter for closed fracture with routine healing: Secondary | ICD-10-CM | POA: Diagnosis not present

## 2021-01-04 DIAGNOSIS — S82892D Other fracture of left lower leg, subsequent encounter for closed fracture with routine healing: Secondary | ICD-10-CM | POA: Diagnosis not present

## 2021-01-04 DIAGNOSIS — Z888 Allergy status to other drugs, medicaments and biological substances status: Secondary | ICD-10-CM | POA: Diagnosis not present

## 2021-01-04 DIAGNOSIS — S82871D Displaced pilon fracture of right tibia, subsequent encounter for closed fracture with routine healing: Secondary | ICD-10-CM | POA: Diagnosis not present

## 2021-01-04 DIAGNOSIS — S8252XD Displaced fracture of medial malleolus of left tibia, subsequent encounter for closed fracture with routine healing: Secondary | ICD-10-CM | POA: Diagnosis not present

## 2021-01-05 ENCOUNTER — Ambulatory Visit (INDEPENDENT_AMBULATORY_CARE_PROVIDER_SITE_OTHER): Payer: 59 | Admitting: Endocrinology

## 2021-01-05 ENCOUNTER — Encounter: Payer: Self-pay | Admitting: Endocrinology

## 2021-01-05 ENCOUNTER — Other Ambulatory Visit: Payer: Self-pay

## 2021-01-05 VITALS — BP 120/78 | HR 99 | Ht 76.0 in | Wt 161.8 lb

## 2021-01-05 DIAGNOSIS — M25671 Stiffness of right ankle, not elsewhere classified: Secondary | ICD-10-CM | POA: Diagnosis not present

## 2021-01-05 DIAGNOSIS — E1065 Type 1 diabetes mellitus with hyperglycemia: Secondary | ICD-10-CM | POA: Diagnosis not present

## 2021-01-05 DIAGNOSIS — M25572 Pain in left ankle and joints of left foot: Secondary | ICD-10-CM | POA: Diagnosis not present

## 2021-01-05 DIAGNOSIS — M25672 Stiffness of left ankle, not elsewhere classified: Secondary | ICD-10-CM | POA: Diagnosis not present

## 2021-01-05 DIAGNOSIS — M25571 Pain in right ankle and joints of right foot: Secondary | ICD-10-CM | POA: Diagnosis not present

## 2021-01-05 LAB — POCT GLUCOSE (DEVICE FOR HOME USE): Glucose Fasting, POC: 463 mg/dL — AB (ref 70–99)

## 2021-01-05 LAB — POCT GLYCOSYLATED HEMOGLOBIN (HGB A1C): Hemoglobin A1C: 10.9 % — AB (ref 4.0–5.6)

## 2021-01-05 NOTE — Progress Notes (Signed)
Patient ID: Jesse Johnson, male   DOB: 08-Oct-1990, 30 y.o.   MRN: [id number]           Reason for Appointment: Follow-up for Type 1 Diabetes   History of Present Illness:          Date of diagnosis of type 1 diabetes mellitus: 11/2016       Background history:   In early 2018 Jesse Johnson was starting to have symptoms of weakness, increased urination and thirst, blurred vision and weight loss Jesse Johnson checked her home blood sugar was 400+ and at his physician's office it was 541 with an A1c of 8.6 Initial labs did not show acidosis Jesse Johnson was started on Synjardy at that time with this Jesse Johnson thinks his blood sugar started getting better A1c was down to 6.6 done in 01/2017 Subsequently because of insurance difficulties Jesse Johnson could not afford this and was tried on metformin and glipizide but blood sugar started going up progressively after this.  Also at some point had tried Trulicity which caused GI side effects  Recent history:      INSULIN regimen: Tresiba 23 units daily Humalog insulin 6-10 units at meals up to 4 times a day  Previously using MEDTRONIC 670 with following settings: BASAL rate = 0.8 at midnight and 0.9 at 6 AM Carbohydrate coverage 1: 15, goal 1: 12 after 8 PM, correction 1: 60 and target 120 with active insulin 4 hours    Current management, blood sugar patterns from pump download and problems identified:  His A1c is much higher at 10.9    Jesse Johnson has not been using his Medtronic pump because of not being able to afford the guardian sensor or the supplies  Also may have had some difficulties with his infusion sites  On his last visit in 2/22 Jesse Johnson was told to use [place] and Humalog  However his blood sugars are significantly high all the time and Jesse Johnson did not report this  Also did not adjust his Evaristo Bury despite high fasting readings  Is usually Jesse Johnson tends to have a dawn phenomenon and Jesse Johnson will take a bolus of about 10 units of Humalog on waking up in the morning especially blood sugars are  high  Not clear if Jesse Johnson is using carbohydrate counting as Jesse Johnson is generally using 6 units at most meals and additional doses for high readings  However Jesse Johnson thinks that even with taking 10 to 12 units of correction doses his blood sugar does not come down much  Jesse Johnson has lost a lot of weight  Also less active overall since his ankle fracture about 10 weeks ago  Blood sugars being checked by [employer] meter Blood sugars by recall PRE-MEAL Fasting Lunch Dinner Bedtime Overall  Glucose range: 200 200 340 200-400   Mean/median:         Previous data:   CGM use % of time  80  Average and SD  196+/-172  Time in range    54    %  % Time Above 180  27  % Time above 250  18  % Time Below target  1    Glycemic patterns summary: His blood sugar patterns are indicating significant variability but also significant hypoglycemia midmorning in late evening.  Hypoglycemia has been occurring twice, apparently from overcorrection   Self-care: The diet that the patient has been following is: tries to limit sweet tea and drinks only one time per day.    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    Weight history:  Wt Readings from Last 3 Encounters:  01/05/21 161 lb 12.8 oz (73.4 kg)  09/27/20 174 lb 6.4 oz (79.1 kg)  06/02/19 175 lb 12.8 oz (79.7 kg)    Glycemic control:   Lab Results  Component Value Date   HGBA1C 7.9 (H) 08/09/2020   HGBA1C 8.0 (H) 09/26/2019   HGBA1C 7.9 (H) 05/28/2019   Lab Results  Component Value Date   MICROALBUR 1.5 08/09/2020   LDLCALC 71 09/26/2019   CREATININE 1.15 08/09/2020   Lab Results  Component Value Date   MICRALBCREAT 1.8 08/09/2020    Lab Results  Component Value Date   FRUCTOSAMINE 430 (H) 12/04/2017      Allergies as of 01/05/2021      Reactions   Dulaglutide Other (See Comments)    Sitagliptin Other (See Comments)      Medication List       Accurate as of Jan 05, 2021  1:29 PM. If you have any questions, ask your nurse or doctor.        benzonatate 100 MG capsule Commonly known as: TESSALON Take 1 capsule (100 mg total) by mouth every 8 (eight) hours.   cetirizine 10 MG tablet Commonly known as: ZyrTEC Allergy Take 1 tablet (10 mg total) by mouth daily.   fluticasone 50 MCG/ACT nasal spray Commonly known as: FLONASE Place 1 spray into both nostrils daily.   glucose blood test strip Commonly known as: Contour Next Test Use as instructed to test 3 times daily for pump   insulin lispro 100 UNIT/ML KwikPen Commonly known as: HumaLOG KwikPen INJECT 6 - 12 UNITS BEFORE EACH MEAL   HumaLOG KwikPen 100 UNIT/ML KwikPen Generic drug: insulin lispro INJECT 6 - 12 UNITS BEFORE EACH MEAL   Insulin Pen Needle 31G X 5 MM Misc Used to inject insulin 4 times a day   Unifine Pentips 31G X 6 MM Misc Generic drug: Insulin Pen Needle USED TO INJECT INSULIN 4 TIMES A DAY   predniSONE 50 MG tablet Commonly known as: DELTASONE Take 1 tablet (50 mg total) by mouth daily with breakfast.   Evaristo Buryresiba FlexTouch 100 UNIT/ML FlexTouch Pen Generic drug: insulin degludec INJECT 22 UNITS UNDER THE SKIN DAILY       Allergies:  Allergies  Allergen Reactions  . Dulaglutide Other (See Comments)  . Sitagliptin Other (See Comments)    Past Medical History:  Diagnosis Date  . Diabetes mellitus without complication (HCC)     No past surgical history on file.  Family History  Problem Relation Age of Onset  . Diabetes Neg Hx   . Heart disease Neg Hx     Social History:  reports that Jesse Johnson has been smoking cigarettes. Jesse Johnson has never used smokeless tobacco. Jesse Johnson reports current alcohol use. Jesse Johnson reports current drug use. Drug: Marijuana.   Review of Systems   Lipid history: No history of hypercholesterolemia    Lab Results  Component Value Date   CHOL 122  09/26/2019   HDL 40.10 09/26/2019   LDLCALC 71 09/26/2019   TRIG 52.0 09/26/2019   CHOLHDL 3 09/26/2019           Hypertension: Not present  BP Readings from  Last 3 Encounters:  01/05/21 120/78  09/27/20 114/76  08/10/20 110/72    Last eye exam was in 7/21  TSH normal in 11/2016  LABS:  No visits with results within 1 Week(s) from this visit.  Latest known visit with results is:  Admission on 08/10/2020, Discharged on 08/10/2020  Component Date Value Ref Range Status  . SARS-CoV-2, NAA 08/10/2020 Detected* Not Detected Final   Comment: Patients who have a positive COVID-19 test result may now have treatment options. Treatment options are available for patients with mild to moderate symptoms and for hospitalized patients. Visit our website at [email or web address] for resources and information. This nucleic acid amplification test was developed and its performance characteristics determined by [employer]. Nucleic acid amplification tests include RT-PCR and TMA. This test has not been FDA cleared or approved. This test has been authorized by FDA under an Emergency Use Authorization (EUA). This test is only authorized for the duration of time the declaration that circumstances exist justifying the authorization of the emergency use of in vitro diagnostic tests for detection of SARS-CoV-2 virus and/or diagnosis of COVID-19 infection under section 564(b)(1) of the Act, 21 U.S.C. 382NKN-3(Z) (1), unless the authorization is terminated or revoked sooner. When diagnostic testing is negativ                          e, the possibility of a false negative result should be considered in the context of a patient's recent exposures and the presence of clinical signs and symptoms consistent with COVID-19. An individual without symptoms of COVID-19 and who is not shedding SARS-CoV-2 virus would expect to have a negative (not detected) result in this assay.      Physical Examination:  BP 120/78 (BP Location: Left Arm, Patient Position: Sitting, Cuff Size: Normal)   Pulse 99   Ht 6\' 4"  (1.93 m)   Wt 161 lb 12.8 oz (73.4 kg)   SpO2 (!) 71%   BMI 19.69 kg/m            ASSESSMENT:  Diabetes type 1, on Medtronic 670 pump  His A1c is 10.9  See history of present illness for detailed discussion of current diabetes management, blood sugar patterns and problems identified  As discussed above Jesse Johnson is doing poorly with basal bolus insulin regimen Also not getting enough insulin both with basal and bolus and is not able to follow any specific calculations for his mealtime insulin, not understanding the need to titrate that based on fasting readings  Since Jesse Johnson has not used the CGM and is using the [employer] brand meter and not able to review his actual readings today Jesse Johnson does have a significant dawn phenomenon by history    PLAN:    Jesse Johnson will increased doses of both [place] and Humalog  Given Jesse Johnson a flowsheet to help titrate the [place] dose starting with 28 units once daily  Jesse Johnson will try to use his pump as a calculator for mealtime and correction doses and will enter 1: 5 for carbohydrate coverage and 1: 34 correction doses  Will fill out the application when available for the OmniPod 5 pump and Jesse Johnson will also try to call the company to get information on his coverage  Discussed blood sugar targets fasting and after meals  Also described the use of the closed-loop system with the OmniPod 5 and how the pump would work  Follow-up in 1 month or when Jesse Johnson starts the new pump  There are no Patient Instructions on file for this visit.      01/05/2021, 1:29 PM   Note: This office note was prepared with Dragon voice recognition system technology. Any transcriptional errors that result from this process are unintentional.

## 2021-01-07 DIAGNOSIS — M25671 Stiffness of right ankle, not elsewhere classified: Secondary | ICD-10-CM | POA: Diagnosis not present

## 2021-01-07 DIAGNOSIS — M25672 Stiffness of left ankle, not elsewhere classified: Secondary | ICD-10-CM | POA: Diagnosis not present

## 2021-01-07 DIAGNOSIS — M25572 Pain in left ankle and joints of left foot: Secondary | ICD-10-CM | POA: Diagnosis not present

## 2021-01-07 DIAGNOSIS — M25571 Pain in right ankle and joints of right foot: Secondary | ICD-10-CM | POA: Diagnosis not present

## 2021-01-11 DIAGNOSIS — M25572 Pain in left ankle and joints of left foot: Secondary | ICD-10-CM | POA: Diagnosis not present

## 2021-01-11 DIAGNOSIS — M25571 Pain in right ankle and joints of right foot: Secondary | ICD-10-CM | POA: Diagnosis not present

## 2021-01-11 DIAGNOSIS — M25672 Stiffness of left ankle, not elsewhere classified: Secondary | ICD-10-CM | POA: Diagnosis not present

## 2021-01-11 DIAGNOSIS — M25671 Stiffness of right ankle, not elsewhere classified: Secondary | ICD-10-CM | POA: Diagnosis not present

## 2021-01-13 DIAGNOSIS — M25672 Stiffness of left ankle, not elsewhere classified: Secondary | ICD-10-CM | POA: Diagnosis not present

## 2021-01-13 DIAGNOSIS — M25572 Pain in left ankle and joints of left foot: Secondary | ICD-10-CM | POA: Diagnosis not present

## 2021-01-13 DIAGNOSIS — M25671 Stiffness of right ankle, not elsewhere classified: Secondary | ICD-10-CM | POA: Diagnosis not present

## 2021-01-13 DIAGNOSIS — M25571 Pain in right ankle and joints of right foot: Secondary | ICD-10-CM | POA: Diagnosis not present

## 2021-01-17 DIAGNOSIS — M25572 Pain in left ankle and joints of left foot: Secondary | ICD-10-CM | POA: Diagnosis not present

## 2021-01-17 DIAGNOSIS — M25571 Pain in right ankle and joints of right foot: Secondary | ICD-10-CM | POA: Diagnosis not present

## 2021-01-17 DIAGNOSIS — M25672 Stiffness of left ankle, not elsewhere classified: Secondary | ICD-10-CM | POA: Diagnosis not present

## 2021-01-17 DIAGNOSIS — M25671 Stiffness of right ankle, not elsewhere classified: Secondary | ICD-10-CM | POA: Diagnosis not present

## 2021-01-18 ENCOUNTER — Other Ambulatory Visit: Payer: Self-pay | Admitting: Endocrinology

## 2021-01-18 ENCOUNTER — Other Ambulatory Visit (HOSPITAL_COMMUNITY): Payer: Self-pay

## 2021-01-18 DIAGNOSIS — E1065 Type 1 diabetes mellitus with hyperglycemia: Secondary | ICD-10-CM

## 2021-01-18 MED ORDER — INSULIN LISPRO (1 UNIT DIAL) 100 UNIT/ML (KWIKPEN)
PEN_INJECTOR | SUBCUTANEOUS | 0 refills | Status: DC
  Filled 2021-01-18: qty 15, 41d supply, fill #0

## 2021-01-18 MED ORDER — UNIFINE PENTIPS 31G X 6 MM MISC
1 refills | Status: AC
  Filled 2021-01-18: qty 100, 25d supply, fill #0
  Filled 2021-03-24: qty 100, 25d supply, fill #1

## 2021-01-18 MED ORDER — TRESIBA FLEXTOUCH 100 UNIT/ML ~~LOC~~ SOPN
PEN_INJECTOR | SUBCUTANEOUS | 2 refills | Status: AC
Start: 2021-01-18 — End: 2022-01-18
  Filled 2021-01-18: qty 15, 68d supply, fill #0
  Filled 2021-03-24: qty 15, 68d supply, fill #1
  Filled 2021-05-20: qty 15, 68d supply, fill #2

## 2021-01-19 ENCOUNTER — Other Ambulatory Visit (HOSPITAL_COMMUNITY): Payer: Self-pay

## 2021-01-19 DIAGNOSIS — M25672 Stiffness of left ankle, not elsewhere classified: Secondary | ICD-10-CM | POA: Diagnosis not present

## 2021-01-19 DIAGNOSIS — M25571 Pain in right ankle and joints of right foot: Secondary | ICD-10-CM | POA: Diagnosis not present

## 2021-01-19 DIAGNOSIS — M25572 Pain in left ankle and joints of left foot: Secondary | ICD-10-CM | POA: Diagnosis not present

## 2021-01-19 DIAGNOSIS — M25671 Stiffness of right ankle, not elsewhere classified: Secondary | ICD-10-CM | POA: Diagnosis not present

## 2021-01-21 DIAGNOSIS — M25672 Stiffness of left ankle, not elsewhere classified: Secondary | ICD-10-CM | POA: Diagnosis not present

## 2021-01-21 DIAGNOSIS — M25572 Pain in left ankle and joints of left foot: Secondary | ICD-10-CM | POA: Diagnosis not present

## 2021-01-21 DIAGNOSIS — M25671 Stiffness of right ankle, not elsewhere classified: Secondary | ICD-10-CM | POA: Diagnosis not present

## 2021-01-21 DIAGNOSIS — M25571 Pain in right ankle and joints of right foot: Secondary | ICD-10-CM | POA: Diagnosis not present

## 2021-01-24 DIAGNOSIS — M25671 Stiffness of right ankle, not elsewhere classified: Secondary | ICD-10-CM | POA: Diagnosis not present

## 2021-01-24 DIAGNOSIS — M25571 Pain in right ankle and joints of right foot: Secondary | ICD-10-CM | POA: Diagnosis not present

## 2021-01-24 DIAGNOSIS — M25672 Stiffness of left ankle, not elsewhere classified: Secondary | ICD-10-CM | POA: Diagnosis not present

## 2021-01-24 DIAGNOSIS — M25572 Pain in left ankle and joints of left foot: Secondary | ICD-10-CM | POA: Diagnosis not present

## 2021-01-26 DIAGNOSIS — M25672 Stiffness of left ankle, not elsewhere classified: Secondary | ICD-10-CM | POA: Diagnosis not present

## 2021-01-26 DIAGNOSIS — M25572 Pain in left ankle and joints of left foot: Secondary | ICD-10-CM | POA: Diagnosis not present

## 2021-01-26 DIAGNOSIS — M25671 Stiffness of right ankle, not elsewhere classified: Secondary | ICD-10-CM | POA: Diagnosis not present

## 2021-01-26 DIAGNOSIS — M25571 Pain in right ankle and joints of right foot: Secondary | ICD-10-CM | POA: Diagnosis not present

## 2021-01-28 DIAGNOSIS — M25572 Pain in left ankle and joints of left foot: Secondary | ICD-10-CM | POA: Diagnosis not present

## 2021-01-28 DIAGNOSIS — M25571 Pain in right ankle and joints of right foot: Secondary | ICD-10-CM | POA: Diagnosis not present

## 2021-01-31 DIAGNOSIS — M25571 Pain in right ankle and joints of right foot: Secondary | ICD-10-CM | POA: Diagnosis not present

## 2021-01-31 DIAGNOSIS — M25572 Pain in left ankle and joints of left foot: Secondary | ICD-10-CM | POA: Diagnosis not present

## 2021-01-31 DIAGNOSIS — M25671 Stiffness of right ankle, not elsewhere classified: Secondary | ICD-10-CM | POA: Diagnosis not present

## 2021-01-31 DIAGNOSIS — M25672 Stiffness of left ankle, not elsewhere classified: Secondary | ICD-10-CM | POA: Diagnosis not present

## 2021-02-04 DIAGNOSIS — M25572 Pain in left ankle and joints of left foot: Secondary | ICD-10-CM | POA: Diagnosis not present

## 2021-02-04 DIAGNOSIS — M25571 Pain in right ankle and joints of right foot: Secondary | ICD-10-CM | POA: Diagnosis not present

## 2021-02-08 ENCOUNTER — Other Ambulatory Visit: Payer: Self-pay

## 2021-02-08 ENCOUNTER — Ambulatory Visit: Payer: 59 | Admitting: Endocrinology

## 2021-02-08 ENCOUNTER — Encounter: Payer: Self-pay | Admitting: Endocrinology

## 2021-02-08 VITALS — BP 104/72 | HR 86 | Ht 76.0 in | Wt 168.6 lb

## 2021-02-08 DIAGNOSIS — E1065 Type 1 diabetes mellitus with hyperglycemia: Secondary | ICD-10-CM | POA: Diagnosis not present

## 2021-02-08 LAB — POCT GLYCOSYLATED HEMOGLOBIN (HGB A1C): Hemoglobin A1C: 9.8 % — AB (ref 4.0–5.6)

## 2021-02-08 LAB — POCT GLUCOSE (DEVICE FOR HOME USE): POC Glucose: 375 mg/dl — AB (ref 70–99)

## 2021-02-08 MED ORDER — OMNIPOD 5 DEXG7G6 INTRO GEN 5 KIT: 1.0000 | PACK | Freq: Once | 0 refills | Status: DC

## 2021-02-08 MED ORDER — OMNIPOD 5 DEXG7G6 INTRO GEN 5 KIT: 1.0000 | PACK | Freq: Once | 0 refills | Status: AC

## 2021-02-08 NOTE — Progress Notes (Signed)
Patient ID: Jesse Johnson, male   DOB: 03/15/1991, 30 y.o.   MRN: [id number]           Reason for Appointment: Follow-up for Type 1 Diabetes   History of Present Illness:          Date of diagnosis of type 1 diabetes mellitus: 11/2016       Background history:   In early 2018 he was starting to have symptoms of weakness, increased urination and thirst, blurred vision and weight loss He checked her home blood sugar was 400+ and at his physician's office it was 541 with an A1c of 8.6 Initial labs did not show acidosis He was started on Synjardy at that time with this he thinks his blood sugar started getting better A1c was down to 6.6 done in 01/2017 Subsequently because of insurance difficulties he could not afford this and was tried on metformin and glipizide but blood sugar started going up progressively after this.  Also at some point had tried Trulicity which caused GI side effects  Recent history:      INSULIN regimen: Tresiba 28 units daily Humalog insulin 8-10 units at meals up to 4 times a day  Previously using MEDTRONIC 670 with following settings: BASAL rate = 0.8 at midnight and 0.9 at 6 AM Carbohydrate coverage 1: 15, goal 1: 12 after 8 PM, correction 1: 60 and target 120 with active insulin 4 hours    Current management, blood sugar patterns from pump download and problems identified:  His A1c is still high at 9.8 On the last visit was much higher at 10.9  Bfst 1 coffee sausage bisciut He has not received his OmniPod pump as yet although he was told online that his insurance will cover it Previously with the Medtronic pump was having difficulties with infusion sites as well as coverage for the guardian sensor On his last visit he was told to increase his [place] because of fasting readings being around 200 Again he says he does not keep a record of his blood sugars on his [employer] brand meter However with increasing his Tyler Aas his fasting readings apparently are  much better Currently he has increased physical activity at work which will bring his blood sugar down significantly before lunchtime and in the afternoon However his blood sugars are significantly high right after breakfast, today 375 He generally eats a sausage biscuit at home prepared at home or otherwise a waffle He says that because of his activity level he usually does not need any mealtime coverage at lunch Blood sugars are higher after dinner but he thinks this is from snacks not covered with insulin  Blood sugars being checked by [employer] meter Blood sugars by recall   PRE-MEAL Fasting Lunch Dinner Bedtime Overall  Glucose range: 120-160   200   Mean/median:     ?    Previous data:   CGM use % of time  80  Average and SD  196+/-172  Time in range    54    %  % Time Above 180  27  % Time above 250  18  % Time Below target  1    Glycemic patterns summary: His blood sugar patterns are indicating significant variability but also significant hypoglycemia midmorning in late evening.  Hypoglycemia has been occurring twice, apparently from overcorrection   Self-care: The diet that the patient has been following is: tries to limit sweet tea and drinks only one time per day.  Typical meal intake: Breakfast is generally biscuit or a sandwich                 Dietician visit, most recent: None CDU visit: 10/2017               Exercise:minimal, he works as a maintenance person    Weight history:  Wt Readings from Last 3 Encounters:  02/08/21 168 lb 9.6 oz (76.5 kg)  01/05/21 161 lb 12.8 oz (73.4 kg)  09/27/20 174 lb 6.4 oz (79.1 kg)    Glycemic control:   Lab Results  Component Value Date   HGBA1C 10.9 (A) 01/05/2021   HGBA1C 7.9 (H) 08/09/2020   HGBA1C 8.0 (H) 09/26/2019   Lab Results  Component Value Date   MICROALBUR 1.5 08/09/2020   LDLCALC 71 09/26/2019   CREATININE 1.15 08/09/2020   Lab Results  Component Value Date   MICRALBCREAT 1.8 08/09/2020     Lab Results  Component Value Date   FRUCTOSAMINE 430 (H) 12/04/2017      Allergies as of 02/08/2021       Reactions   Dulaglutide Other (See Comments)   Sitagliptin Other (See Comments)        Medication List        Accurate as of February 08, 2021 10:56 AM. If you have any questions, ask your nurse or doctor.          benzonatate 100 MG capsule Commonly known as: TESSALON Take 1 capsule (100 mg total) by mouth every 8 (eight) hours.   cetirizine 10 MG tablet Commonly known as: ZyrTEC Allergy Take 1 tablet (10 mg total) by mouth daily.   fluticasone 50 MCG/ACT nasal spray Commonly known as: FLONASE Place 1 spray into both nostrils daily.   glucose blood test strip Commonly known as: Contour Next Test Use as instructed to test 3 times daily for pump   insulin lispro 100 UNIT/ML KwikPen Commonly known as: HumaLOG KwikPen INJECT 6 - 12 UNITS BEFORE EACH MEAL   HumaLOG KwikPen 100 UNIT/ML KwikPen Generic drug: insulin lispro INJECT 6 - 12 UNITS BEFORE EACH MEAL   Insulin Pen Needle 31G X 5 MM Misc Used to inject insulin 4 times a day   Unifine Pentips 31G X 6 MM Misc Generic drug: Insulin Pen Needle USED TO INJECT INSULIN 4 TIMES A DAY   Omnipod 5 G6 Intro (Gen 5) Kit 1 kit by Does not apply route once for 1 dose. Started by: Elayne Snare, MD   predniSONE 50 MG tablet Commonly known as: DELTASONE Take 1 tablet (50 mg total) by mouth daily with breakfast.   Tyler Aas FlexTouch 100 UNIT/ML FlexTouch Pen Generic drug: insulin degludec INJECT 22 UNITS UNDER THE SKIN DAILY        Allergies:  Allergies  Allergen Reactions   Dulaglutide Other (See Comments)   Sitagliptin Other (See Comments)    Past Medical History:  Diagnosis Date   Diabetes mellitus without complication ([place])     No past surgical history on file.  Family History  Problem Relation Age of Onset   Diabetes Neg Hx    Heart disease Neg Hx     Social History:  reports that he  has been smoking cigarettes. He has never used smokeless tobacco. He reports current alcohol use. He reports current drug use. Drug: Marijuana.   Review of Systems   Lipid history: No history of hypercholesterolemia    Lab Results  Component Value Date   CHOL 122  09/26/2019   HDL 40.10 09/26/2019   LDLCALC 71 09/26/2019   TRIG 52.0 09/26/2019   CHOLHDL 3 09/26/2019           Hypertension: Not present  BP Readings from Last 3 Encounters:  02/08/21 104/72  01/05/21 120/78  09/27/20 114/76    Last eye exam was in 7/21  TSH normal in 11/2016  LABS:  No visits with results within 1 Week(s) from this visit.  Latest known visit with results is:  Office Visit on 01/05/2021  Component Date Value Ref Range Status   Hemoglobin A1C 01/05/2021 10.9 (A) 4.0 - 5.6 % Final   Glucose Fasting, POC 01/05/2021 463 (A) 70 - 99 mg/dL Final    Physical Examination:  BP 104/72   Pulse 86   Ht '6\' 4"'  (1.93 m)   Wt 168 lb 9.6 oz (76.5 kg)   SpO2 99%   BMI 20.52 kg/m            ASSESSMENT:  Diabetes type 1  His A1c is still high at 9.8  See history of present illness for detailed discussion of current diabetes management, blood sugar patterns and problems identified  Currently on Tresiba and Humalog  As above his fasting readings are reportedly fairly good and he still has difficulties with high readings after breakfast and dinner  Discussed that he likely will only improve his control when getting the closed-loop insulin pump  PLAN:   He will increase his Humalog at suppertime Also will likely need separate coverage for snacks at night  No change in [place] unless morning sugars are consistently higher Given him a list of items to choose for breakfast and needs to eliminate all high fat foods like biscuits, sausages and also had a protein if he is eating a waffle in the morning Likely may need to have extra snacks when he is more active and blood sugars are low  normal Discussed appropriate treatment of hypoglycemia Follow-up after he starts the new pump  Will fax the prescription for his new pump to [facility] and also eprescribed to [facility], currently not covered at his local pharmacy    There are no Patient Instructions on file for this visit.      02/08/2021, 10:56 AM   Note: This office note was prepared with Dragon voice recognition system technology. Any transcriptional errors that result from this process are unintentional.

## 2021-02-09 DIAGNOSIS — M25571 Pain in right ankle and joints of right foot: Secondary | ICD-10-CM | POA: Diagnosis not present

## 2021-02-09 DIAGNOSIS — M25572 Pain in left ankle and joints of left foot: Secondary | ICD-10-CM | POA: Diagnosis not present

## 2021-02-16 DIAGNOSIS — M25672 Stiffness of left ankle, not elsewhere classified: Secondary | ICD-10-CM | POA: Diagnosis not present

## 2021-02-16 DIAGNOSIS — M25671 Stiffness of right ankle, not elsewhere classified: Secondary | ICD-10-CM | POA: Diagnosis not present

## 2021-03-23 ENCOUNTER — Other Ambulatory Visit: Payer: Self-pay | Admitting: Endocrinology

## 2021-03-23 MED ORDER — OMNIPOD 5 DEXG7G6 INTRO GEN 5 KIT: 1.0000 | PACK | Freq: Once | 0 refills | Status: AC

## 2021-03-24 ENCOUNTER — Other Ambulatory Visit (HOSPITAL_COMMUNITY): Payer: Self-pay

## 2021-03-24 ENCOUNTER — Other Ambulatory Visit: Payer: Self-pay | Admitting: Endocrinology

## 2021-03-24 DIAGNOSIS — E1065 Type 1 diabetes mellitus with hyperglycemia: Secondary | ICD-10-CM

## 2021-03-24 MED ORDER — INSULIN LISPRO (1 UNIT DIAL) 100 UNIT/ML (KWIKPEN)
PEN_INJECTOR | SUBCUTANEOUS | 0 refills | Status: AC
  Filled 2021-03-24 – 2021-07-18 (×3): qty 15, 41d supply, fill #0

## 2021-03-31 ENCOUNTER — Other Ambulatory Visit: Payer: Self-pay | Admitting: Endocrinology

## 2021-03-31 MED ORDER — OMNIPOD 5 DEXG7G6 INTRO GEN 5 KIT: 1.0000 | PACK | Freq: Once | 0 refills | Status: AC

## 2021-04-01 ENCOUNTER — Other Ambulatory Visit (HOSPITAL_COMMUNITY): Payer: Self-pay

## 2021-04-07 DIAGNOSIS — S92101D Unspecified fracture of right talus, subsequent encounter for fracture with routine healing: Secondary | ICD-10-CM | POA: Diagnosis not present

## 2021-04-07 DIAGNOSIS — S92102D Unspecified fracture of left talus, subsequent encounter for fracture with routine healing: Secondary | ICD-10-CM | POA: Diagnosis not present

## 2021-04-07 DIAGNOSIS — S82891D Other fracture of right lower leg, subsequent encounter for closed fracture with routine healing: Secondary | ICD-10-CM | POA: Diagnosis not present

## 2021-04-07 DIAGNOSIS — S82301D Unspecified fracture of lower end of right tibia, subsequent encounter for closed fracture with routine healing: Secondary | ICD-10-CM | POA: Diagnosis not present

## 2021-04-07 DIAGNOSIS — S82842D Displaced bimalleolar fracture of left lower leg, subsequent encounter for closed fracture with routine healing: Secondary | ICD-10-CM | POA: Diagnosis not present

## 2021-05-18 ENCOUNTER — Other Ambulatory Visit: Payer: Self-pay | Admitting: Endocrinology

## 2021-05-18 MED ORDER — DEXCOM G6 SENSOR MISC: 3 refills | Status: DC

## 2021-05-18 MED ORDER — OMNIPOD 5 DEXG7G6 INTRO GEN 5 KIT: 1.0000 | PACK | Freq: Once | 0 refills | Status: AC

## 2021-05-18 MED ORDER — OMNIPOD 5 DEXG7G6 PODS GEN 5 MISC: 1.0000 | 3 refills | Status: AC

## 2021-05-18 MED ORDER — DEXCOM G6 TRANSMITTER MISC: 1.0000 | Freq: Once | 1 refills | Status: AC

## 2021-05-20 ENCOUNTER — Other Ambulatory Visit (HOSPITAL_COMMUNITY): Payer: Self-pay

## 2021-05-24 ENCOUNTER — Other Ambulatory Visit (HOSPITAL_COMMUNITY): Payer: Self-pay

## 2021-05-24 ENCOUNTER — Telehealth: Payer: Self-pay | Admitting: Endocrinology

## 2021-05-24 DIAGNOSIS — E1065 Type 1 diabetes mellitus with hyperglycemia: Secondary | ICD-10-CM | POA: Diagnosis not present

## 2021-05-24 DIAGNOSIS — Z6820 Body mass index (BMI) 20.0-20.9, adult: Secondary | ICD-10-CM | POA: Diagnosis not present

## 2021-05-24 DIAGNOSIS — Z794 Long term (current) use of insulin: Secondary | ICD-10-CM | POA: Diagnosis not present

## 2021-05-24 MED ORDER — INSULIN LISPRO (1 UNIT DIAL) 100 UNIT/ML (KWIKPEN)
PEN_INJECTOR | SUBCUTANEOUS | 3 refills | Status: AC
  Filled 2021-05-24: qty 9, 30d supply, fill #0
  Filled 2021-12-06: qty 9, 30d supply, fill #1

## 2021-05-24 MED ORDER — TRESIBA FLEXTOUCH 200 UNIT/ML ~~LOC~~ SOPN
PEN_INJECTOR | SUBCUTANEOUS | 3 refills | Status: AC
  Filled 2021-05-24: qty 6, 40d supply, fill #0
  Filled 2021-07-18: qty 6, 40d supply, fill #1

## 2021-05-24 NOTE — Telephone Encounter (Signed)
PA dept for [facility] needs prior auth form completed and faxed to [phone] for [facility] sensor and transmitter.

## 2021-05-25 ENCOUNTER — Other Ambulatory Visit (HOSPITAL_COMMUNITY): Payer: Self-pay

## 2021-05-26 ENCOUNTER — Telehealth: Payer: Self-pay | Admitting: Pharmacy Technician

## 2021-05-26 ENCOUNTER — Other Ambulatory Visit (HOSPITAL_COMMUNITY): Payer: Self-pay

## 2021-05-26 NOTE — Telephone Encounter (Signed)
Patient Advocate Encounter   Received notification from [employer] Meds that prior authorization for Dexcom is required.   PA submitted on 05/26/21 Key [id number]  Status is pending    [facility] will continue to follow:   Sherilyn Dacosta, CPhT Patient Advocate [facility] Phone: [phone] Fax:  [phone]

## 2021-05-27 ENCOUNTER — Telehealth: Payer: Self-pay | Admitting: Pharmacy Technician

## 2021-05-27 NOTE — Telephone Encounter (Signed)
Patient Advocate Encounter  Received notification from [employer] that prior authorization for DEXCOM G6 TRANSMITTER is required.   PA submitted on 10.14.22 Key [id number] Status is pending   [facility] will continue to follow  Ricke Hey, CPhT Patient Advocate [facility] Phone: [phone] Fax:  [phone]

## 2021-05-30 ENCOUNTER — Telehealth: Payer: Self-pay | Admitting: Nutrition

## 2021-05-30 NOTE — Telephone Encounter (Signed)
ERROR

## 2021-05-30 NOTE — Telephone Encounter (Signed)
LVM that pump and supplies are all approved, but are waiting to hear back from him before being able to ship him his pump.   Told to call me when he gets pump supplies and Dexcom for training appointment.

## 2021-05-30 NOTE — Telephone Encounter (Signed)
Received notification from [employer] regarding a prior authorization for DEXCOM G6 SENSOR. Authorization has been APPROVED from 10.14.22 to 10.13.23.    Authorization # [id number]

## 2021-05-30 NOTE — Telephone Encounter (Signed)
Received notification from [employer] regarding a prior authorization for DEXCOM G6 SENSOR. Authorization has been APPROVED from 10.5.22 to 10.4.23.    Authorization # [id number]

## 2021-05-30 NOTE — Telephone Encounter (Signed)
Received notification from [employer] regarding a prior authorization for DEXCOM G6 SENSOR. Authorization has been APPROVED from 10.14.22 to 10.13.23.    Authorization # [id number]

## 2021-06-02 ENCOUNTER — Other Ambulatory Visit (HOSPITAL_COMMUNITY): Payer: Self-pay

## 2021-06-02 NOTE — Telephone Encounter (Signed)
[facility] Patient Advocate Encounter  Prior Authorization for [employer] has been approved.    PA# PA Case ID: [id number] Effective dates: 05/27/21 through 05/26/22  Patients co-pay is $0.   Sherilyn Dacosta, CPhT Patient Advocate [facility] Phone: [phone] Fax:  [phone]

## 2021-06-13 ENCOUNTER — Other Ambulatory Visit (HOSPITAL_COMMUNITY): Payer: Self-pay

## 2021-06-13 DIAGNOSIS — E1065 Type 1 diabetes mellitus with hyperglycemia: Secondary | ICD-10-CM | POA: Diagnosis not present

## 2021-06-13 DIAGNOSIS — Z794 Long term (current) use of insulin: Secondary | ICD-10-CM | POA: Diagnosis not present

## 2021-06-13 DIAGNOSIS — Z6820 Body mass index (BMI) 20.0-20.9, adult: Secondary | ICD-10-CM | POA: Diagnosis not present

## 2021-06-13 MED ORDER — GVOKE HYPOPEN 2-PACK 1 MG/0.2ML ~~LOC~~ SOAJ
SUBCUTANEOUS | 4 refills | Status: AC
  Filled 2021-06-13: qty 0.4, 30d supply, fill #0

## 2021-06-14 ENCOUNTER — Other Ambulatory Visit (HOSPITAL_COMMUNITY): Payer: Self-pay

## 2021-06-20 ENCOUNTER — Other Ambulatory Visit (HOSPITAL_COMMUNITY): Payer: Self-pay

## 2021-06-20 MED ORDER — FREESTYLE LIBRE 2 SENSOR MISC
6 refills | Status: DC
  Filled 2021-06-20: qty 2, 28d supply, fill #0
  Filled 2021-07-22: qty 2, 28d supply, fill #1
  Filled 2021-08-19: qty 2, 28d supply, fill #2
  Filled 2021-09-19: qty 2, 28d supply, fill #3
  Filled 2021-10-18: qty 2, 28d supply, fill #4
  Filled 2021-11-15: qty 2, 28d supply, fill #5
  Filled 2021-12-06: qty 2, 28d supply, fill #6

## 2021-06-21 ENCOUNTER — Other Ambulatory Visit (HOSPITAL_COMMUNITY): Payer: Self-pay

## 2021-07-18 ENCOUNTER — Other Ambulatory Visit (HOSPITAL_COMMUNITY): Payer: Self-pay

## 2021-07-22 ENCOUNTER — Other Ambulatory Visit (HOSPITAL_COMMUNITY): Payer: Self-pay

## 2021-08-11 DIAGNOSIS — Z6820 Body mass index (BMI) 20.0-20.9, adult: Secondary | ICD-10-CM | POA: Diagnosis not present

## 2021-08-11 DIAGNOSIS — Z794 Long term (current) use of insulin: Secondary | ICD-10-CM | POA: Diagnosis not present

## 2021-08-11 DIAGNOSIS — E1065 Type 1 diabetes mellitus with hyperglycemia: Secondary | ICD-10-CM | POA: Diagnosis not present

## 2021-08-11 DIAGNOSIS — E1165 Type 2 diabetes mellitus with hyperglycemia: Secondary | ICD-10-CM | POA: Diagnosis not present

## 2021-08-19 ENCOUNTER — Other Ambulatory Visit (HOSPITAL_COMMUNITY): Payer: Self-pay

## 2021-09-01 ENCOUNTER — Other Ambulatory Visit (HOSPITAL_COMMUNITY): Payer: Self-pay

## 2021-09-01 MED ORDER — TRESIBA FLEXTOUCH 200 UNIT/ML ~~LOC~~ SOPN
PEN_INJECTOR | SUBCUTANEOUS | 3 refills | Status: AC
  Filled 2021-09-01: qty 9, 69d supply, fill #0
  Filled 2021-09-19: qty 15, 58d supply, fill #0
  Filled 2022-01-20: qty 15, 58d supply, fill #1

## 2021-09-01 MED ORDER — INSULIN LISPRO (1 UNIT DIAL) 100 UNIT/ML (KWIKPEN)
PEN_INJECTOR | SUBCUTANEOUS | 3 refills | Status: AC
  Filled 2021-09-01: qty 18, 60d supply, fill #0
  Filled 2021-09-19: qty 9, 30d supply, fill #0
  Filled 2022-04-26: qty 9, 30d supply, fill #1
  Filled 2022-07-11: qty 9, 30d supply, fill #2

## 2021-09-09 ENCOUNTER — Other Ambulatory Visit (HOSPITAL_COMMUNITY): Payer: Self-pay

## 2021-09-19 ENCOUNTER — Other Ambulatory Visit (HOSPITAL_COMMUNITY): Payer: Self-pay

## 2021-09-26 DIAGNOSIS — R051 Acute cough: Secondary | ICD-10-CM | POA: Diagnosis not present

## 2021-09-26 DIAGNOSIS — J0100 Acute maxillary sinusitis, unspecified: Secondary | ICD-10-CM | POA: Diagnosis not present

## 2021-09-26 DIAGNOSIS — J029 Acute pharyngitis, unspecified: Secondary | ICD-10-CM | POA: Diagnosis not present

## 2021-09-30 DIAGNOSIS — R509 Fever, unspecified: Secondary | ICD-10-CM | POA: Diagnosis not present

## 2021-09-30 DIAGNOSIS — R051 Acute cough: Secondary | ICD-10-CM | POA: Diagnosis not present

## 2021-10-18 ENCOUNTER — Other Ambulatory Visit (HOSPITAL_COMMUNITY): Payer: Self-pay

## 2021-10-27 NOTE — Telephone Encounter (Signed)
Called Patient who stated he is now receiving treatment with his PCP and will not be returning to [facility]. ?

## 2021-11-15 ENCOUNTER — Other Ambulatory Visit (HOSPITAL_COMMUNITY): Payer: Self-pay

## 2021-12-06 ENCOUNTER — Other Ambulatory Visit (HOSPITAL_COMMUNITY): Payer: Self-pay

## 2021-12-07 ENCOUNTER — Other Ambulatory Visit (HOSPITAL_COMMUNITY): Payer: Self-pay

## 2022-01-10 ENCOUNTER — Other Ambulatory Visit (HOSPITAL_COMMUNITY): Payer: Self-pay

## 2022-01-11 ENCOUNTER — Other Ambulatory Visit (HOSPITAL_COMMUNITY): Payer: Self-pay

## 2022-01-12 ENCOUNTER — Other Ambulatory Visit (HOSPITAL_COMMUNITY): Payer: Self-pay

## 2022-01-12 MED ORDER — FREESTYLE LIBRE 2 SENSOR MISC
0 refills | Status: DC
  Filled 2022-01-12: qty 2, 28d supply, fill #0

## 2022-01-12 MED ORDER — INSULIN LISPRO (1 UNIT DIAL) 100 UNIT/ML (KWIKPEN)
PEN_INJECTOR | SUBCUTANEOUS | 0 refills | Status: AC
  Filled 2022-01-12: qty 15, 50d supply, fill #0

## 2022-01-12 MED ORDER — TRESIBA FLEXTOUCH 200 UNIT/ML ~~LOC~~ SOPN
PEN_INJECTOR | SUBCUTANEOUS | 0 refills | Status: AC
  Filled 2022-01-12: qty 15, 58d supply, fill #0
  Filled 2022-09-18: qty 9, 69d supply, fill #0

## 2022-01-20 ENCOUNTER — Other Ambulatory Visit (HOSPITAL_COMMUNITY): Payer: Self-pay

## 2022-01-20 DIAGNOSIS — E1065 Type 1 diabetes mellitus with hyperglycemia: Secondary | ICD-10-CM | POA: Diagnosis not present

## 2022-01-20 DIAGNOSIS — Z794 Long term (current) use of insulin: Secondary | ICD-10-CM | POA: Diagnosis not present

## 2022-02-10 ENCOUNTER — Other Ambulatory Visit (HOSPITAL_COMMUNITY): Payer: Self-pay

## 2022-02-10 MED ORDER — FREESTYLE LIBRE 2 SENSOR MISC
6 refills | Status: DC
  Filled 2022-02-10: qty 2, 28d supply, fill #0
  Filled 2022-03-13: qty 2, 28d supply, fill #1
  Filled 2022-04-07: qty 2, 28d supply, fill #2
  Filled 2022-05-15: qty 2, 28d supply, fill #3
  Filled 2022-06-13: qty 2, 28d supply, fill #4
  Filled 2022-07-11: qty 2, 28d supply, fill #5
  Filled 2022-09-18: qty 2, 28d supply, fill #6

## 2022-02-10 MED ORDER — TRESIBA FLEXTOUCH 200 UNIT/ML ~~LOC~~ SOPN
PEN_INJECTOR | SUBCUTANEOUS | 6 refills | Status: DC
  Filled 2022-02-10: qty 9, 20d supply, fill #0
  Filled 2022-04-26: qty 9, 60d supply, fill #0
  Filled 2022-07-11: qty 9, 60d supply, fill #1
  Filled 2022-12-08: qty 9, 60d supply, fill #2

## 2022-02-10 MED ORDER — INSULIN LISPRO (1 UNIT DIAL) 100 UNIT/ML (KWIKPEN)
PEN_INJECTOR | SUBCUTANEOUS | 6 refills | Status: DC
  Filled 2022-02-10: qty 30, 66d supply, fill #0
  Filled 2022-09-18: qty 15, 30d supply, fill #0
  Filled 2023-01-19: qty 15, 30d supply, fill #1

## 2022-03-13 ENCOUNTER — Other Ambulatory Visit (HOSPITAL_COMMUNITY): Payer: Self-pay

## 2022-03-14 ENCOUNTER — Other Ambulatory Visit (HOSPITAL_COMMUNITY): Payer: Self-pay

## 2022-03-15 ENCOUNTER — Other Ambulatory Visit (HOSPITAL_COMMUNITY): Payer: Self-pay

## 2022-04-07 ENCOUNTER — Other Ambulatory Visit (HOSPITAL_COMMUNITY): Payer: Self-pay

## 2022-04-26 ENCOUNTER — Other Ambulatory Visit (HOSPITAL_COMMUNITY): Payer: Self-pay

## 2022-05-15 ENCOUNTER — Other Ambulatory Visit (HOSPITAL_COMMUNITY): Payer: Self-pay

## 2022-06-13 ENCOUNTER — Other Ambulatory Visit (HOSPITAL_COMMUNITY): Payer: Self-pay

## 2022-06-14 ENCOUNTER — Other Ambulatory Visit (HOSPITAL_COMMUNITY): Payer: Self-pay

## 2022-07-11 ENCOUNTER — Other Ambulatory Visit (HOSPITAL_COMMUNITY): Payer: Self-pay

## 2022-07-12 ENCOUNTER — Other Ambulatory Visit (HOSPITAL_COMMUNITY): Payer: Self-pay

## 2022-08-16 ENCOUNTER — Other Ambulatory Visit (HOSPITAL_COMMUNITY): Payer: Self-pay

## 2022-08-16 MED ORDER — FREESTYLE LIBRE 2 SENSOR MISC
0 refills | Status: DC
  Filled 2022-08-16: qty 2, 28d supply, fill #0

## 2022-08-19 ENCOUNTER — Other Ambulatory Visit (HOSPITAL_COMMUNITY): Payer: Self-pay

## 2022-08-24 DIAGNOSIS — R194 Change in bowel habit: Secondary | ICD-10-CM | POA: Diagnosis not present

## 2022-08-24 DIAGNOSIS — E1065 Type 1 diabetes mellitus with hyperglycemia: Secondary | ICD-10-CM | POA: Diagnosis not present

## 2022-08-24 DIAGNOSIS — E1165 Type 2 diabetes mellitus with hyperglycemia: Secondary | ICD-10-CM | POA: Diagnosis not present

## 2022-09-18 ENCOUNTER — Other Ambulatory Visit (HOSPITAL_COMMUNITY): Payer: Self-pay

## 2022-09-19 ENCOUNTER — Other Ambulatory Visit (HOSPITAL_COMMUNITY): Payer: Self-pay

## 2022-10-17 ENCOUNTER — Other Ambulatory Visit (HOSPITAL_COMMUNITY): Payer: Self-pay

## 2022-10-17 MED ORDER — FREESTYLE LIBRE 2 SENSOR MISC
6 refills | Status: DC
  Filled 2022-10-17: qty 2, 28d supply, fill #0
  Filled 2022-11-13: qty 2, 28d supply, fill #1
  Filled 2022-12-08: qty 2, 28d supply, fill #2
  Filled 2023-01-12: qty 2, 28d supply, fill #3
  Filled 2023-02-09: qty 2, 28d supply, fill #4
  Filled 2023-03-07: qty 2, 28d supply, fill #5
  Filled 2023-04-10 (×2): qty 2, 28d supply, fill #6

## 2022-10-18 ENCOUNTER — Other Ambulatory Visit (HOSPITAL_COMMUNITY): Payer: Self-pay

## 2022-10-31 DIAGNOSIS — E109 Type 1 diabetes mellitus without complications: Secondary | ICD-10-CM | POA: Diagnosis not present

## 2022-10-31 DIAGNOSIS — R195 Other fecal abnormalities: Secondary | ICD-10-CM | POA: Diagnosis not present

## 2022-10-31 DIAGNOSIS — K625 Hemorrhage of anus and rectum: Secondary | ICD-10-CM | POA: Diagnosis not present

## 2022-11-02 ENCOUNTER — Other Ambulatory Visit (HOSPITAL_COMMUNITY): Payer: Self-pay

## 2022-11-09 ENCOUNTER — Other Ambulatory Visit (HOSPITAL_COMMUNITY): Payer: Self-pay

## 2022-11-09 MED ORDER — PEG 3350-KCL-NA BICARB-NACL 420 G PO SOLR
ORAL | 0 refills | Status: AC
  Filled 2022-11-09: qty 4000, 1d supply, fill #0

## 2022-11-09 MED ORDER — BISACODYL 5 MG PO TBEC: 10.0000 mg | DELAYED_RELEASE_TABLET | Freq: Two times a day (BID) | ORAL | 0 refills | Status: AC

## 2022-11-10 ENCOUNTER — Other Ambulatory Visit (HOSPITAL_COMMUNITY): Payer: Self-pay

## 2022-11-13 ENCOUNTER — Other Ambulatory Visit (HOSPITAL_COMMUNITY): Payer: Self-pay

## 2022-11-14 DIAGNOSIS — R195 Other fecal abnormalities: Secondary | ICD-10-CM | POA: Diagnosis not present

## 2022-11-15 DIAGNOSIS — K648 Other hemorrhoids: Secondary | ICD-10-CM | POA: Diagnosis not present

## 2022-11-15 DIAGNOSIS — R197 Diarrhea, unspecified: Secondary | ICD-10-CM | POA: Diagnosis not present

## 2022-11-15 DIAGNOSIS — K635 Polyp of colon: Secondary | ICD-10-CM | POA: Diagnosis not present

## 2022-11-15 DIAGNOSIS — K625 Hemorrhage of anus and rectum: Secondary | ICD-10-CM | POA: Diagnosis not present

## 2022-11-17 ENCOUNTER — Other Ambulatory Visit (HOSPITAL_COMMUNITY): Payer: Self-pay

## 2022-11-17 MED ORDER — CREON 36000-114000 UNITS PO CPEP
ORAL_CAPSULE | ORAL | 3 refills | Status: AC
  Filled 2022-11-17 – 2022-11-30 (×2): qty 240, 30d supply, fill #0

## 2022-11-20 ENCOUNTER — Other Ambulatory Visit (HOSPITAL_COMMUNITY): Payer: Self-pay

## 2022-11-20 MED ORDER — BUDESONIDE 3 MG PO CPEP
ORAL_CAPSULE | ORAL | 0 refills | Status: AC
  Filled 2022-11-20 – 2022-11-30 (×2): qty 126, 56d supply, fill #0

## 2022-11-21 ENCOUNTER — Other Ambulatory Visit: Payer: Self-pay

## 2022-11-23 DIAGNOSIS — E1065 Type 1 diabetes mellitus with hyperglycemia: Secondary | ICD-10-CM | POA: Diagnosis not present

## 2022-11-27 ENCOUNTER — Other Ambulatory Visit (HOSPITAL_COMMUNITY): Payer: Self-pay

## 2022-11-28 ENCOUNTER — Other Ambulatory Visit (HOSPITAL_COMMUNITY): Payer: Self-pay

## 2022-11-30 ENCOUNTER — Other Ambulatory Visit (HOSPITAL_COMMUNITY): Payer: Self-pay

## 2022-12-08 ENCOUNTER — Other Ambulatory Visit (HOSPITAL_COMMUNITY): Payer: Self-pay

## 2022-12-12 ENCOUNTER — Other Ambulatory Visit (HOSPITAL_COMMUNITY): Payer: Self-pay

## 2022-12-21 DIAGNOSIS — E1065 Type 1 diabetes mellitus with hyperglycemia: Secondary | ICD-10-CM | POA: Diagnosis not present

## 2022-12-21 DIAGNOSIS — K8681 Exocrine pancreatic insufficiency: Secondary | ICD-10-CM | POA: Diagnosis not present

## 2022-12-21 DIAGNOSIS — Z794 Long term (current) use of insulin: Secondary | ICD-10-CM | POA: Diagnosis not present

## 2023-01-12 ENCOUNTER — Other Ambulatory Visit (HOSPITAL_COMMUNITY): Payer: Self-pay

## 2023-01-19 ENCOUNTER — Other Ambulatory Visit (HOSPITAL_COMMUNITY): Payer: Self-pay

## 2023-01-19 ENCOUNTER — Other Ambulatory Visit: Payer: Self-pay

## 2023-01-23 DIAGNOSIS — K52831 Collagenous colitis: Secondary | ICD-10-CM | POA: Diagnosis not present

## 2023-01-23 DIAGNOSIS — K8681 Exocrine pancreatic insufficiency: Secondary | ICD-10-CM | POA: Diagnosis not present

## 2023-02-09 ENCOUNTER — Other Ambulatory Visit (HOSPITAL_COMMUNITY): Payer: Self-pay

## 2023-02-10 ENCOUNTER — Other Ambulatory Visit (HOSPITAL_COMMUNITY): Payer: Self-pay

## 2023-02-20 DIAGNOSIS — L84 Corns and callosities: Secondary | ICD-10-CM | POA: Diagnosis not present

## 2023-03-07 ENCOUNTER — Other Ambulatory Visit (HOSPITAL_COMMUNITY): Payer: Self-pay

## 2023-03-08 ENCOUNTER — Ambulatory Visit: Payer: Commercial Managed Care - PPO | Admitting: Podiatry

## 2023-03-08 ENCOUNTER — Other Ambulatory Visit (HOSPITAL_COMMUNITY): Payer: Self-pay

## 2023-03-08 DIAGNOSIS — Q828 Other specified congenital malformations of skin: Secondary | ICD-10-CM | POA: Diagnosis not present

## 2023-03-08 DIAGNOSIS — E1100 Type 2 diabetes mellitus with hyperosmolarity without nonketotic hyperglycemic-hyperosmolar coma (NKHHC): Secondary | ICD-10-CM | POA: Diagnosis not present

## 2023-03-08 DIAGNOSIS — M216X2 Other acquired deformities of left foot: Secondary | ICD-10-CM | POA: Diagnosis not present

## 2023-03-08 DIAGNOSIS — M216X1 Other acquired deformities of right foot: Secondary | ICD-10-CM

## 2023-03-08 MED ORDER — TRESIBA FLEXTOUCH 200 UNIT/ML ~~LOC~~ SOPN
26.0000 [IU] | PEN_INJECTOR | Freq: Every evening | SUBCUTANEOUS | 1 refills | Status: DC
  Filled 2023-03-08: qty 9, 69d supply, fill #0
  Filled 2023-05-09: qty 9, 69d supply, fill #1
  Filled 2023-05-09: qty 9, 60d supply, fill #1
  Filled 2023-06-02: qty 9, 69d supply, fill #1

## 2023-03-08 NOTE — Progress Notes (Signed)
    Subjective:  Patient ID: Jesse Johnson, male    DOB: June 25, 1991,  MRN: [id number]  ELAI Johnson presents to clinic today for:  Chief Complaint  Patient presents with   Callouses    Patient came in today for right foot lesion started 3 months ago, left hallux callus,    Patient presents with the above complaint.  States he does not recall stepping on any foreign object and denies having drainage from the Area.  States that it feels like he is stepping on a needle when he puts weight on the particular spot which he points to an area near submet 2 right foot.  Patient is diabetic and was diagnosed approximately 7 years ago.  At that time he was diagnosed his blood sugar was over 800 and he was having problems with vision and frequency with urination.  He states that he has had some difficulties managing his blood sugar.  PCP is Merri Brunette, MD.  Past Medical History:  Diagnosis Date   Diabetes mellitus without complication (HCC)     No past surgical history on file.  Allergies  Allergen Reactions   Dulaglutide Other (See Comments)   Sitagliptin Other (See Comments)   Objective:  There were no vitals filed for this visit.  [name] is a pleasant 32 y.o. male in NAD. AAO x 3.  Vascular Examination: Capillary refill time is 3-5 seconds to toes bilateral. Palpable pedal pulses b/l LE. Digital hair present b/l. No pedal edema b/l. Skin temperature gradient WNL b/l. No varicosities b/l. No cyanosis or clubbing noted b/l.   Dermatological Examination: Pedal skin with normal turgor, texture and tone b/l. No open wounds. No interdigital macerations b/l.   Hyperkeratotic lesion present, with pain on palpation, located right submet 2 which is focal in nature with pain on palpation.  There are diffuse hyperkeratotic lesions on the plantar medial aspect the hallux IPJ bilateral consistent with pinch calluses..  Neurological Examination: Protective sensation intact with  Semmes-Weinstein 10 gram monofilament b/l LE.   Musculoskeletal Examination: Muscle strength 5/5 to all LE muscle groups b/l.  Patient has a collapsing midfoot bilateral and resting calcaneal stance position.  Overpronation noted.  Assessment/Plan: 1. Porokeratosis   2. Type 2 diabetes mellitus with hyperosmolarity, uncontrolled (HCC)   3. Pronation deformity of left foot   4. Pronation deformity of right foot     The hyperkeratotic lesions were sharply debrided/shaved with sterile #313 blade.  The right submet 2 lesion was treated with Salinocaine ointment and a Band-Aid.  He can remove this at bedtime tonight.  Patient was fitted for power step inserts for additional arch support and this will also help cut down on the incidence of his pinch calluses recurring.  Discussed with the patient what is causing the corns/calluses and reviewed treatment options today, including shaving the the painful lesion(s), off-loading techniques and pads, custom orthotics / shoe modifications.   Return if symptoms worsen or fail to improve.   Clerance Lav, DPM, FACFAS [facility]     [address] [place], [place] [address]                Office [phone]  Fax [phone]

## 2023-03-12 ENCOUNTER — Other Ambulatory Visit (HOSPITAL_COMMUNITY): Payer: Self-pay

## 2023-04-10 ENCOUNTER — Other Ambulatory Visit: Payer: Self-pay

## 2023-04-10 ENCOUNTER — Other Ambulatory Visit (HOSPITAL_COMMUNITY): Payer: Self-pay

## 2023-04-11 ENCOUNTER — Other Ambulatory Visit (HOSPITAL_COMMUNITY): Payer: Self-pay

## 2023-04-11 ENCOUNTER — Other Ambulatory Visit: Payer: Self-pay

## 2023-04-11 MED ORDER — INSULIN LISPRO (1 UNIT DIAL) 100 UNIT/ML (KWIKPEN)
PEN_INJECTOR | SUBCUTANEOUS | 6 refills | Status: AC
  Filled 2023-04-11 – 2023-05-09 (×2): qty 15, 30d supply, fill #0

## 2023-04-18 ENCOUNTER — Other Ambulatory Visit: Payer: Self-pay

## 2023-04-23 DIAGNOSIS — E1065 Type 1 diabetes mellitus with hyperglycemia: Secondary | ICD-10-CM | POA: Diagnosis not present

## 2023-04-26 ENCOUNTER — Other Ambulatory Visit (HOSPITAL_COMMUNITY): Payer: Self-pay

## 2023-04-30 ENCOUNTER — Other Ambulatory Visit (HOSPITAL_BASED_OUTPATIENT_CLINIC_OR_DEPARTMENT_OTHER): Payer: Self-pay

## 2023-04-30 ENCOUNTER — Other Ambulatory Visit (HOSPITAL_COMMUNITY): Payer: Self-pay

## 2023-04-30 DIAGNOSIS — E1065 Type 1 diabetes mellitus with hyperglycemia: Secondary | ICD-10-CM | POA: Diagnosis not present

## 2023-04-30 DIAGNOSIS — K8681 Exocrine pancreatic insufficiency: Secondary | ICD-10-CM | POA: Diagnosis not present

## 2023-04-30 DIAGNOSIS — Z794 Long term (current) use of insulin: Secondary | ICD-10-CM | POA: Diagnosis not present

## 2023-04-30 MED ORDER — FREESTYLE LIBRE 3 PLUS SENSOR MISC
11 refills | Status: DC
  Filled 2023-04-30: qty 2, 30d supply, fill #0
  Filled 2023-06-07: qty 2, 28d supply, fill #0
  Filled 2023-07-10: qty 2, 28d supply, fill #1
  Filled 2023-10-09: qty 2, 28d supply, fill #2

## 2023-05-07 ENCOUNTER — Other Ambulatory Visit (HOSPITAL_COMMUNITY): Payer: Self-pay

## 2023-05-09 ENCOUNTER — Other Ambulatory Visit: Payer: Self-pay

## 2023-05-09 ENCOUNTER — Other Ambulatory Visit (HOSPITAL_COMMUNITY): Payer: Self-pay

## 2023-05-09 MED ORDER — FREESTYLE LIBRE 3 PLUS SENSOR MISC
1.0000 | 11 refills | Status: DC
  Filled 2023-05-09: qty 2, 30d supply, fill #0
  Filled 2023-08-09: qty 2, 30d supply, fill #1

## 2023-05-10 ENCOUNTER — Other Ambulatory Visit (HOSPITAL_COMMUNITY): Payer: Self-pay

## 2023-05-10 MED ORDER — FREESTYLE LIBRE 2 SENSOR MISC
6 refills | Status: DC
  Filled 2023-05-10: qty 2, 28d supply, fill #0

## 2023-05-11 ENCOUNTER — Other Ambulatory Visit (HOSPITAL_COMMUNITY): Payer: Self-pay

## 2023-05-17 ENCOUNTER — Other Ambulatory Visit (HOSPITAL_COMMUNITY): Payer: Self-pay

## 2023-06-02 ENCOUNTER — Other Ambulatory Visit (HOSPITAL_COMMUNITY): Payer: Self-pay

## 2023-06-07 ENCOUNTER — Other Ambulatory Visit (HOSPITAL_COMMUNITY): Payer: Self-pay

## 2023-06-08 ENCOUNTER — Other Ambulatory Visit (HOSPITAL_COMMUNITY): Payer: Self-pay

## 2023-06-28 ENCOUNTER — Encounter: Payer: Self-pay | Admitting: Podiatry

## 2023-06-28 ENCOUNTER — Ambulatory Visit: Payer: Commercial Managed Care - PPO | Admitting: Podiatry

## 2023-06-28 DIAGNOSIS — B070 Plantar wart: Secondary | ICD-10-CM | POA: Diagnosis not present

## 2023-06-29 NOTE — Progress Notes (Signed)
Subjective:   Patient ID: Jesse Johnson, male   DOB: 32 y.o.   MRN: [id number]   HPI Patient presents with lesions submetatarsal right stating that it is reoccurred and sore to walk on   ROS      Objective:  Physical Exam  Neurovascular status intact lesion subright foot that upon debridement shows pinpoint bleeding but does have a slight lucent core to it that was treated about 3 months ago     Assessment:  Possibility for verruca plantaris versus porokeratotic type lesion     Plan:  H&P done sterile prep and did sharp debridement of lesion with no iatrogenic bleeding and applied chemical agent to create immune response sterile dressing that I want him to wear for 48 hours take it off earlier if problems were occur and he will be seen back as symptoms indicate

## 2023-07-10 ENCOUNTER — Other Ambulatory Visit (HOSPITAL_COMMUNITY): Payer: Self-pay

## 2023-07-28 ENCOUNTER — Other Ambulatory Visit (HOSPITAL_COMMUNITY): Payer: Self-pay

## 2023-08-02 ENCOUNTER — Other Ambulatory Visit (HOSPITAL_COMMUNITY): Payer: Self-pay

## 2023-08-02 MED ORDER — TRESIBA FLEXTOUCH 200 UNIT/ML ~~LOC~~ SOPN
26.0000 [IU] | PEN_INJECTOR | Freq: Every evening | SUBCUTANEOUS | 1 refills | Status: DC
  Filled 2023-08-02: qty 9, 69d supply, fill #0
  Filled 2023-10-09 (×2): qty 9, 69d supply, fill #1

## 2023-08-09 ENCOUNTER — Other Ambulatory Visit (HOSPITAL_COMMUNITY): Payer: Self-pay

## 2023-08-09 ENCOUNTER — Other Ambulatory Visit: Payer: Self-pay

## 2023-08-10 ENCOUNTER — Other Ambulatory Visit (HOSPITAL_COMMUNITY): Payer: Self-pay

## 2023-08-23 DIAGNOSIS — E1065 Type 1 diabetes mellitus with hyperglycemia: Secondary | ICD-10-CM | POA: Diagnosis not present

## 2023-08-29 ENCOUNTER — Ambulatory Visit: Payer: Commercial Managed Care - PPO | Admitting: Podiatry

## 2023-08-29 ENCOUNTER — Encounter: Payer: Self-pay | Admitting: Podiatry

## 2023-08-29 DIAGNOSIS — B070 Plantar wart: Secondary | ICD-10-CM

## 2023-08-29 NOTE — Progress Notes (Signed)
 Subjective:   Patient ID: Jesse Johnson, male   DOB: 33 y.o.   MRN: [id number]   HPI Patient presents stating that he has this lesion plantar right it is doing some better but its gotten very sore again neuro   ROS      Objective:  Physical Exam  Ocular status intact lesions of second metatarsal proximal right painful when pressed with pinpoint bleeding upon debridement     Assessment:  Probability this is verruca plantaris versus porokeratotic lesion painful     Plan:  Sharp sterile debridement of the lesion accomplished placed padding around it applied chemical agents great immune response with sterile dressing aware 48 hours explained what to do if blistering were to occur reappoint to recheck as needed

## 2023-08-30 ENCOUNTER — Other Ambulatory Visit (HOSPITAL_COMMUNITY): Payer: Self-pay

## 2023-08-30 DIAGNOSIS — K8681 Exocrine pancreatic insufficiency: Secondary | ICD-10-CM | POA: Diagnosis not present

## 2023-08-30 DIAGNOSIS — Z794 Long term (current) use of insulin: Secondary | ICD-10-CM | POA: Diagnosis not present

## 2023-08-30 DIAGNOSIS — E1065 Type 1 diabetes mellitus with hyperglycemia: Secondary | ICD-10-CM | POA: Diagnosis not present

## 2023-08-30 MED ORDER — FREESTYLE LIBRE 3 PLUS SENSOR MISC
11 refills | Status: DC
  Filled 2023-08-30: qty 2, 30d supply, fill #0
  Filled 2023-09-03: qty 2, 28d supply, fill #0
  Filled 2023-11-09: qty 2, 30d supply, fill #1
  Filled 2023-12-10: qty 2, 30d supply, fill #2
  Filled 2024-02-13: qty 2, 30d supply, fill #3

## 2023-08-30 MED ORDER — INSULIN LISPRO (1 UNIT DIAL) 100 UNIT/ML (KWIKPEN)
PEN_INJECTOR | SUBCUTANEOUS | 5 refills | Status: AC
  Filled 2023-08-30: qty 15, 33d supply, fill #0
  Filled 2023-12-10: qty 15, 33d supply, fill #1

## 2023-09-03 ENCOUNTER — Other Ambulatory Visit (HOSPITAL_COMMUNITY): Payer: Self-pay

## 2023-10-09 ENCOUNTER — Other Ambulatory Visit (HOSPITAL_COMMUNITY): Payer: Self-pay

## 2023-10-09 ENCOUNTER — Other Ambulatory Visit: Payer: Self-pay

## 2023-11-09 ENCOUNTER — Other Ambulatory Visit (HOSPITAL_COMMUNITY): Payer: Self-pay

## 2023-12-10 ENCOUNTER — Other Ambulatory Visit (HOSPITAL_COMMUNITY): Payer: Self-pay

## 2023-12-10 MED ORDER — TRESIBA FLEXTOUCH 200 UNIT/ML ~~LOC~~ SOPN
26.0000 [IU] | PEN_INJECTOR | Freq: Every evening | SUBCUTANEOUS | 1 refills | Status: AC
  Filled 2023-12-10: qty 9, 69d supply, fill #0

## 2024-01-01 DIAGNOSIS — E1065 Type 1 diabetes mellitus with hyperglycemia: Secondary | ICD-10-CM | POA: Diagnosis not present

## 2024-01-08 ENCOUNTER — Other Ambulatory Visit: Payer: Self-pay

## 2024-01-08 ENCOUNTER — Other Ambulatory Visit (HOSPITAL_COMMUNITY): Payer: Self-pay

## 2024-01-08 DIAGNOSIS — E1065 Type 1 diabetes mellitus with hyperglycemia: Secondary | ICD-10-CM | POA: Diagnosis not present

## 2024-01-08 DIAGNOSIS — K8681 Exocrine pancreatic insufficiency: Secondary | ICD-10-CM | POA: Diagnosis not present

## 2024-01-08 DIAGNOSIS — Z794 Long term (current) use of insulin: Secondary | ICD-10-CM | POA: Diagnosis not present

## 2024-01-08 MED ORDER — DEXCOM G7 SENSOR MISC
11 refills | Status: AC
  Filled 2024-01-08: qty 3, 30d supply, fill #0
  Filled 2024-02-13 (×2): qty 3, 30d supply, fill #1
  Filled 2024-03-15: qty 3, 30d supply, fill #2
  Filled 2024-03-15: qty 3, 30d supply, fill #0
  Filled 2024-04-16: qty 3, 30d supply, fill #1
  Filled 2024-05-06 – 2024-05-10 (×2): qty 3, 30d supply, fill #2
  Filled 2024-06-04: qty 3, 30d supply, fill #3
  Filled 2024-07-07: qty 3, 30d supply, fill #4
  Filled 2024-07-14: qty 3, 30d supply, fill #0
  Filled 2024-08-11: qty 3, 30d supply, fill #1
  Filled 2024-09-05: qty 3, 30d supply, fill #2

## 2024-01-12 ENCOUNTER — Other Ambulatory Visit (HOSPITAL_COMMUNITY): Payer: Self-pay

## 2024-02-04 ENCOUNTER — Other Ambulatory Visit (HOSPITAL_COMMUNITY): Payer: Self-pay

## 2024-02-04 MED ORDER — INSULIN LISPRO 100 UNIT/ML IJ SOLN
50.0000 [IU] | Freq: Every day | INTRAMUSCULAR | 5 refills | Status: AC
  Filled 2024-02-04: qty 20, 40d supply, fill #0
  Filled 2024-03-15: qty 20, 40d supply, fill #1
  Filled 2024-05-06: qty 20, 40d supply, fill #2
  Filled 2024-06-04: qty 20, 40d supply, fill #3
  Filled 2024-07-25 (×2): qty 20, 40d supply, fill #4
  Filled 2024-09-17: qty 20, 40d supply, fill #0

## 2024-02-04 MED ORDER — OMNIPOD 5 DEXG7G6 PODS GEN 5 MISC
1.0000 | 11 refills | Status: AC
  Filled 2024-02-04: qty 10, 30d supply, fill #0
  Filled 2024-03-15: qty 10, 30d supply, fill #1
  Filled 2024-05-06: qty 10, 30d supply, fill #2
  Filled 2024-06-05: qty 10, 30d supply, fill #3
  Filled 2024-07-07: qty 10, 30d supply, fill #4
  Filled 2024-08-11: qty 10, 30d supply, fill #5
  Filled 2024-09-17: qty 10, 30d supply, fill #0

## 2024-02-05 ENCOUNTER — Other Ambulatory Visit (HOSPITAL_COMMUNITY): Payer: Self-pay

## 2024-02-13 ENCOUNTER — Other Ambulatory Visit: Payer: Self-pay

## 2024-02-13 ENCOUNTER — Other Ambulatory Visit (HOSPITAL_COMMUNITY): Payer: Self-pay

## 2024-03-15 ENCOUNTER — Other Ambulatory Visit (HOSPITAL_BASED_OUTPATIENT_CLINIC_OR_DEPARTMENT_OTHER): Payer: Self-pay

## 2024-03-15 ENCOUNTER — Other Ambulatory Visit (HOSPITAL_COMMUNITY): Payer: Self-pay

## 2024-04-16 ENCOUNTER — Other Ambulatory Visit (HOSPITAL_COMMUNITY): Payer: Self-pay

## 2024-05-06 ENCOUNTER — Other Ambulatory Visit (HOSPITAL_COMMUNITY): Payer: Self-pay

## 2024-05-08 ENCOUNTER — Other Ambulatory Visit (HOSPITAL_COMMUNITY): Payer: Self-pay

## 2024-05-10 ENCOUNTER — Other Ambulatory Visit (HOSPITAL_COMMUNITY): Payer: Self-pay

## 2024-05-20 DIAGNOSIS — E1065 Type 1 diabetes mellitus with hyperglycemia: Secondary | ICD-10-CM | POA: Diagnosis not present

## 2024-05-20 DIAGNOSIS — K8681 Exocrine pancreatic insufficiency: Secondary | ICD-10-CM | POA: Diagnosis not present

## 2024-05-20 DIAGNOSIS — Z794 Long term (current) use of insulin: Secondary | ICD-10-CM | POA: Diagnosis not present

## 2024-06-04 ENCOUNTER — Other Ambulatory Visit (HOSPITAL_COMMUNITY): Payer: Self-pay

## 2024-06-05 ENCOUNTER — Other Ambulatory Visit (HOSPITAL_COMMUNITY): Payer: Self-pay

## 2024-06-11 DIAGNOSIS — Z0000 Encounter for general adult medical examination without abnormal findings: Secondary | ICD-10-CM | POA: Diagnosis not present

## 2024-06-11 DIAGNOSIS — E1165 Type 2 diabetes mellitus with hyperglycemia: Secondary | ICD-10-CM | POA: Diagnosis not present

## 2024-06-12 ENCOUNTER — Other Ambulatory Visit (HOSPITAL_COMMUNITY): Payer: Self-pay

## 2024-06-12 DIAGNOSIS — Z0000 Encounter for general adult medical examination without abnormal findings: Secondary | ICD-10-CM | POA: Diagnosis not present

## 2024-06-12 DIAGNOSIS — Z23 Encounter for immunization: Secondary | ICD-10-CM | POA: Diagnosis not present

## 2024-06-12 MED ORDER — BUDESONIDE 3 MG PO CPEP
9.0000 mg | ORAL_CAPSULE | Freq: Every day | ORAL | 1 refills | Status: AC
  Filled 2024-06-12: qty 90, 30d supply, fill #0

## 2024-07-08 ENCOUNTER — Other Ambulatory Visit (HOSPITAL_BASED_OUTPATIENT_CLINIC_OR_DEPARTMENT_OTHER): Payer: Self-pay

## 2024-07-08 ENCOUNTER — Other Ambulatory Visit: Payer: Self-pay

## 2024-07-09 ENCOUNTER — Other Ambulatory Visit (HOSPITAL_COMMUNITY): Payer: Self-pay

## 2024-07-14 ENCOUNTER — Other Ambulatory Visit (HOSPITAL_COMMUNITY): Payer: Self-pay

## 2024-07-14 ENCOUNTER — Other Ambulatory Visit (HOSPITAL_BASED_OUTPATIENT_CLINIC_OR_DEPARTMENT_OTHER): Payer: Self-pay

## 2024-07-25 ENCOUNTER — Other Ambulatory Visit (HOSPITAL_COMMUNITY): Payer: Self-pay

## 2024-08-11 ENCOUNTER — Other Ambulatory Visit (HOSPITAL_COMMUNITY): Payer: Self-pay

## 2024-09-05 ENCOUNTER — Other Ambulatory Visit (HOSPITAL_COMMUNITY): Payer: Self-pay

## 2024-09-09 ENCOUNTER — Other Ambulatory Visit (HOSPITAL_COMMUNITY): Payer: Self-pay

## 2024-09-09 ENCOUNTER — Other Ambulatory Visit (HOSPITAL_BASED_OUTPATIENT_CLINIC_OR_DEPARTMENT_OTHER): Payer: Self-pay

## 2024-09-09 MED ORDER — GATIFLOXACIN 0.5 % OP SOLN
1.0000 [drp] | OPHTHALMIC | 0 refills | Status: AC
  Filled 2024-09-09: qty 5, 12d supply, fill #0
  Filled 2024-09-09 (×2): qty 2.5, 6d supply, fill #0

## 2024-09-10 ENCOUNTER — Other Ambulatory Visit (HOSPITAL_BASED_OUTPATIENT_CLINIC_OR_DEPARTMENT_OTHER): Payer: Self-pay

## 2024-09-17 ENCOUNTER — Other Ambulatory Visit (HOSPITAL_BASED_OUTPATIENT_CLINIC_OR_DEPARTMENT_OTHER): Payer: Self-pay

## 2024-09-18 ENCOUNTER — Other Ambulatory Visit (HOSPITAL_COMMUNITY): Payer: Self-pay

## 2024-09-18 MED ORDER — TOBRAMYCIN-DEXAMETHASONE 0.3-0.1 % OP SUSP
OPHTHALMIC | 0 refills | Status: AC
  Filled 2024-09-18: qty 5, 20d supply, fill #0
